# Patient Record
Sex: Female | Born: 1945
Health system: Southern US, Community
[De-identification: ages and names within clinical notes are randomized; demographics above are authoritative.]

## PROBLEM LIST (undated history)

## (undated) DIAGNOSIS — I10 Essential (primary) hypertension: Secondary | ICD-10-CM

## (undated) DIAGNOSIS — Z720 Tobacco use: Secondary | ICD-10-CM

## (undated) DIAGNOSIS — F419 Anxiety disorder, unspecified: Secondary | ICD-10-CM

## (undated) DIAGNOSIS — L309 Dermatitis, unspecified: Secondary | ICD-10-CM

## (undated) DIAGNOSIS — E785 Hyperlipidemia, unspecified: Secondary | ICD-10-CM

## (undated) DIAGNOSIS — K219 Gastro-esophageal reflux disease without esophagitis: Secondary | ICD-10-CM

## (undated) HISTORY — DX: Hyperlipidemia, unspecified: E78.5

## (undated) HISTORY — DX: Gastro-esophageal reflux disease without esophagitis: K21.9

## (undated) HISTORY — DX: Anxiety disorder, unspecified: F41.9

## (undated) HISTORY — PX: BREAST SURGERY: SHX581

## (undated) HISTORY — DX: Dermatitis, unspecified: L30.9

## (undated) HISTORY — DX: Essential (primary) hypertension: I10

## (undated) HISTORY — DX: Tobacco use: Z72.0

---

## 1997-10-28 ENCOUNTER — Emergency Department (HOSPITAL_COMMUNITY): Admission: EM | Admit: 1997-10-28 | Discharge: 1997-10-28 | Payer: Self-pay

## 1998-04-10 ENCOUNTER — Emergency Department (HOSPITAL_COMMUNITY): Admission: EM | Admit: 1998-04-10 | Discharge: 1998-04-10 | Payer: Self-pay | Admitting: Emergency Medicine

## 2004-01-17 ENCOUNTER — Ambulatory Visit: Payer: Self-pay | Admitting: Internal Medicine

## 2004-03-24 ENCOUNTER — Ambulatory Visit: Payer: Self-pay | Admitting: Internal Medicine

## 2005-12-21 ENCOUNTER — Ambulatory Visit: Payer: Self-pay | Admitting: Internal Medicine

## 2006-02-14 ENCOUNTER — Ambulatory Visit: Payer: Self-pay | Admitting: Internal Medicine

## 2007-02-28 ENCOUNTER — Telehealth: Payer: Self-pay | Admitting: Internal Medicine

## 2007-03-13 ENCOUNTER — Ambulatory Visit: Payer: Self-pay | Admitting: Internal Medicine

## 2007-03-13 DIAGNOSIS — I10 Essential (primary) hypertension: Secondary | ICD-10-CM

## 2007-03-13 DIAGNOSIS — K219 Gastro-esophageal reflux disease without esophagitis: Secondary | ICD-10-CM | POA: Insufficient documentation

## 2008-03-05 ENCOUNTER — Ambulatory Visit: Payer: Self-pay | Admitting: Internal Medicine

## 2008-03-05 DIAGNOSIS — M25569 Pain in unspecified knee: Secondary | ICD-10-CM

## 2012-02-10 ENCOUNTER — Ambulatory Visit (INDEPENDENT_AMBULATORY_CARE_PROVIDER_SITE_OTHER): Admitting: Family Medicine

## 2012-02-10 ENCOUNTER — Encounter: Payer: Self-pay | Admitting: Family Medicine

## 2012-02-10 ENCOUNTER — Telehealth: Payer: Self-pay | Admitting: Family Medicine

## 2012-02-10 VITALS — BP 178/118 | HR 98 | Temp 97.7°F | Ht 66.5 in | Wt 168.0 lb

## 2012-02-10 DIAGNOSIS — Z7689 Persons encountering health services in other specified circumstances: Secondary | ICD-10-CM

## 2012-02-10 DIAGNOSIS — I10 Essential (primary) hypertension: Secondary | ICD-10-CM

## 2012-02-10 DIAGNOSIS — F17200 Nicotine dependence, unspecified, uncomplicated: Secondary | ICD-10-CM

## 2012-02-10 DIAGNOSIS — Z720 Tobacco use: Secondary | ICD-10-CM | POA: Insufficient documentation

## 2012-02-10 DIAGNOSIS — Z7189 Other specified counseling: Secondary | ICD-10-CM

## 2012-02-10 HISTORY — DX: Tobacco use: Z72.0

## 2012-02-10 LAB — LIPID PANEL
Cholesterol: 215 mg/dL — ABNORMAL HIGH (ref 0–200)
HDL: 27.8 mg/dL — ABNORMAL LOW (ref >39.00)
Total CHOL/HDL Ratio: 8
Triglycerides: 108 mg/dL (ref 0.0–149.0)
VLDL: 21.6 mg/dL (ref 0.0–40.0)

## 2012-02-10 LAB — BASIC METABOLIC PANEL
BUN: 8 mg/dL (ref 6–23)
CO2: 28 mEq/L (ref 19–32)
Calcium: 9.5 mg/dL (ref 8.4–10.5)
Chloride: 104 mEq/L (ref 96–112)
Creatinine, Ser: 0.6 mg/dL (ref 0.4–1.2)
GFR: 126.89 mL/min (ref >60.00)
Glucose, Bld: 100 mg/dL — ABNORMAL HIGH (ref 70–99)
Potassium: 4.1 mEq/L (ref 3.5–5.1)
Sodium: 139 mEq/L (ref 135–145)

## 2012-02-10 LAB — LDL CHOLESTEROL, DIRECT: Direct LDL: 150.4 mg/dL

## 2012-02-10 LAB — HEMOGLOBIN A1C: Hgb A1c MFr Bld: 6.2 % (ref 4.6–6.5)

## 2012-02-10 MED ORDER — AMLODIPINE BESYLATE 10 MG PO TABS: 10.0000 mg | ORAL_TABLET | Freq: Every day | ORAL | Status: DC

## 2012-02-10 MED ORDER — BENAZEPRIL-HYDROCHLOROTHIAZIDE 20-12.5 MG PO TABS: 1.0000 | ORAL_TABLET | Freq: Every day | ORAL | Status: DC

## 2012-02-10 NOTE — Progress Notes (Signed)
Chief Complaint  Patient presents with  . Establish Care    HPI:  Alexandra Flores is here to establish care. Used to see Dr. Amador Cunas, but hasn't been in the office in a long time. Reports has has felt well.  Last PCP and physical: 6 years ago  Has the following chronic problems and concerns today:  Patient Active Problem List  Diagnosis  . HYPERTENSION -denies: CP, SOB, palpitaitons, HA, vision changes, urinary symptoms, fatigue, weight loss - feel sgreat -does not take BP medications except maybe 1-2 times per month  . GERD  . KNEE PAIN  . Tobacco use -wants to discuss quitting, but not interested now - really likes to smoke  Occ bulging sensation in vaginal area when bowling - chronic, no pain, no vaginal discharge or irritation.   Health Maintenance: -needs health maintenance -does not want flu vaccine will discuss other vaccine at her physical  ROS: See pertinent positives and negatives per HPI.  Past Medical History  Diagnosis Date  . Hypertension   . GERD (gastroesophageal reflux disease)     Family History  Problem Relation Age of Onset  . Stroke Mother   . Hypertension Mother   . Cancer Sister 81    'stomach cancer'     History   Social History  . Marital Status: Married    Spouse Name: N/A    Number of Children: N/A  . Years of Education: N/A   Social History Main Topics  . Smoking status: Current Every Day Smoker -- 0.8 packs/day    Types: Cigarettes  . Smokeless tobacco: None  . Alcohol Use: Yes     Comment: occ   . Drug Use: None  . Sexually Active: None   Other Topics Concern  . None   Social History Narrative   Work or School: none, retired, worked at [employer] for 25 years Home Situation: lives with mother and husbandSpiritual Beliefs: methodist, christianLifestyle: does bowling every Wednesday, no regular exercise otherwise, diet is not great - husband is active and exercises    Current outpatient prescriptions:amLODipine (NORVASC)  10 MG tablet, Take 1 tablet (10 mg total) by mouth daily., Disp: 90 tablet, Rfl: 3;  benazepril-hydrochlorthiazide (LOTENSIN HCT) 20-12.5 MG per tablet, Take 1 tablet by mouth daily., Disp: 90 tablet, Rfl: 3  EXAM:  Filed Vitals:   02/10/12 0802  BP: 178/118  Pulse: 98  Temp: 97.7 F (36.5 C)    Body mass index is 26.71 kg/(m^2).  GENERAL: vitals reviewed and listed above, alert, oriented, appears well hydrated and in no acute distress  HEENT: atraumatic, conjunttiva clear, no obvious abnormalities on inspection of external nose and ears  NECK: no obvious masses on inspection  LUNGS: clear to auscultation bilaterally, no wheezes, rales or rhonchi, good air movement  CV: HRRR, no peripheral edema  MS: moves all extremities without noticeable abnormality  PSYCH: pleasant and cooperative, no obvious depression or anxiety  ASSESSMENT AND PLAN:  Discussed the following assessment and plan:  1. Tobacco use    2. HYPERTENSION  amLODipine (NORVASC) 10 MG tablet, benazepril-hydrochlorthiazide (LOTENSIN HCT) 20-12.5 MG per tablet, Basic metabolic panel  3. Establishing care with new doctor, encounter for  Lipid Panel, Hemoglobin A1c    FASTING LABS TODAY: -refilled BP meds and close follow up - advised of risks with elevated BP - completely asymptomatic at this time -smoking cessation counseling for 3 minutes - will continue to discuss -advised to healthy diet and exercise -follow up in 2 weeks  to recheck BP and for physical to assess pelvic floor and pap and health maintenance  -We reviewed the PMH, PSH, FH, SH, Meds and Allergies. -We provided refills for any medications we will prescribe as needed. -We addressed current concerns per orders and patient instructions. -We have advised patient to follow up per instructions below.  -Patient advised to return or notify a doctor immediately if symptoms worsen or persist or new concerns arise.  Patient Instructions  -We have  ordered labs or studies at this visit. It can take up to 1-2 weeks for results and processing. We will contact you with instructions IF your results are abnormal. Normal results will be released to your [facility]. If you have not heard from [place] or can not find your results in [facility] in 2 weeks please contact our office.  -PLEASE SIGN UP FOR MYCHART TODAY   We recommend the following healthy lifestyle measures: - eat a healthy diet consisting of lots of vegetables, fruits, beans, nuts, seeds, healthy meats such as white chicken and fish and whole grains.  - avoid fried foods, fast food, processed foods, sodas, red meet and other fattening foods.  - get a least 150 minutes of aerobic exercise per week.   Call quit line for information about quitting smoking  Start exercising 4 - 5 days per week  TAKE BLOOD PRESSURE MEDICATIONS EVERY DAY  Follow up in: 2 weeks for physical      KIM, Dahlia Client R.

## 2012-02-10 NOTE — Patient Instructions (Addendum)
-  We have ordered labs or studies at this visit. It can take up to 1-2 weeks for results and processing. We will contact you with instructions IF your results are abnormal. Normal results will be released to your [facility]. If you have not heard from [place] or can not find your results in [facility] in 2 weeks please contact our office.  -PLEASE SIGN UP FOR MYCHART TODAY   We recommend the following healthy lifestyle measures: - eat a healthy diet consisting of lots of vegetables, fruits, beans, nuts, seeds, healthy meats such as white chicken and fish and whole grains.  - avoid fried foods, fast food, processed foods, sodas, red meet and other fattening foods.  - get a least 150 minutes of aerobic exercise per week.   Call quit line for information about quitting smoking  Start exercising 4 - 5 days per week  TAKE BLOOD PRESSURE MEDICATIONS EVERY DAY  Follow up in: 2 weeks for physical

## 2012-02-10 NOTE — Telephone Encounter (Signed)
Please let patient know,   -cholesterol is a bit high -diabetes screening lab is a little high (>5.6) indicating a risk for developing diabetes  The best treatment to hopefully reverse these findings and prevent adverse health outcomes is a healthy diet and regular exercise.  We can discuss further at her follow up.

## 2012-02-11 NOTE — Telephone Encounter (Signed)
Called and spoke with pt and pt is aware.  

## 2012-02-24 ENCOUNTER — Ambulatory Visit (INDEPENDENT_AMBULATORY_CARE_PROVIDER_SITE_OTHER): Admitting: Family Medicine

## 2012-02-24 ENCOUNTER — Other Ambulatory Visit (HOSPITAL_COMMUNITY)
Admission: RE | Admit: 2012-02-24 | Discharge: 2012-02-24 | Disposition: A | Discharge status: Home / Self Care | Source: Ambulatory Visit | Attending: Family Medicine | Admitting: Family Medicine

## 2012-02-24 ENCOUNTER — Encounter: Payer: Self-pay | Admitting: Family Medicine

## 2012-02-24 VITALS — BP 200/100 | HR 91 | Temp 98.5°F | Ht 66.5 in | Wt 169.0 lb

## 2012-02-24 DIAGNOSIS — I10 Essential (primary) hypertension: Secondary | ICD-10-CM

## 2012-02-24 DIAGNOSIS — Z01419 Encounter for gynecological examination (general) (routine) without abnormal findings: Secondary | ICD-10-CM | POA: Insufficient documentation

## 2012-02-24 DIAGNOSIS — R87810 Cervical high risk human papillomavirus (HPV) DNA test positive: Secondary | ICD-10-CM | POA: Insufficient documentation

## 2012-02-24 DIAGNOSIS — Z0000 Encounter for general adult medical examination without abnormal findings: Secondary | ICD-10-CM

## 2012-02-24 DIAGNOSIS — N8111 Cystocele, midline: Secondary | ICD-10-CM

## 2012-02-24 DIAGNOSIS — Z1151 Encounter for screening for human papillomavirus (HPV): Secondary | ICD-10-CM | POA: Insufficient documentation

## 2012-02-24 DIAGNOSIS — Z23 Encounter for immunization: Secondary | ICD-10-CM

## 2012-02-24 DIAGNOSIS — IMO0002 Reserved for concepts with insufficient information to code with codable children: Secondary | ICD-10-CM

## 2012-02-24 MED ORDER — PRAVASTATIN SODIUM 20 MG PO TABS: 20.0000 mg | ORAL_TABLET | Freq: Every day | ORAL | Status: DC

## 2012-02-24 MED ORDER — BENAZEPRIL-HYDROCHLOROTHIAZIDE 20-25 MG PO TABS: 1.0000 | ORAL_TABLET | Freq: Every day | ORAL | Status: DC

## 2012-02-24 NOTE — Patient Instructions (Addendum)
-  please call and schedule your mammogram  -please send in stool cards per instruction  -please schedule an appointment with a dentist  -Please take two of your Lotensin-HCT tabs daily until you get the new prescription  -Please start the new medication (Pravastatin) for you cholesterol  We recommend the following healthy lifestyle measures: - eat a healthy diet consisting of lots of vegetables, fruits, beans, nuts, seeds, healthy meats such as white chicken and fish and whole grains.  - avoid fried foods, fast food, processed foods, sodas, red meet and other fattening foods.  - get a least 150 minutes of aerobic exercise per week.   -We placed a referral for you as discussed. It usually takes about 1-2 weeks to process and schedule this referral. If you have not heard from [place] regarding this appointment in 2 weeks please contact our office.  -follow up in 1 month for you blood pressure and cholesterol

## 2012-02-24 NOTE — Progress Notes (Signed)
Chief Complaint  Patient presents with  . Annual Exam    HPI:  Here for CPE:  -Concerns today: BP meds restarted last visit (norvasc and lotensin), bulge in vaginal area when bowling  -Diet: variety of foods, balance and well rounded, larger portion sizes  -Exercise: walking every day  -Diabetes and Dyslipidemia Screening: 01/2012 - HLD and prediabetes  -Hx of HTN: being treated, high today, denies CP, SOB, palpations, HA, vision changes, swelling  -Vaccines: wants tetanus today, refused all other vaccines  -pap history: last pap in 2008, reports always normal  -FDLMP: postmenopausal  -sexual activity: yes, female partner, no new partners  -wants STI testing: no  -FH breast, colon or ovarian ca: see FH -last colonoscopy: never had a colonoscopy, wants to do stool cards -last dexa: never had this, refused -last mammo: 2008, no breast lesions or concerns  -Alcohol, Tobacco, drug use: see social history -has cut back on cigarettes to 1/2 pack -motivated to quit: 6/10 -wants to quit because it is impacting her BP and she wants to live long -not interested in meds to hlep her quit  Review of Systems - negative except where noted above  Past Medical History  Diagnosis Date  . Hypertension   . GERD (gastroesophageal reflux disease)     Family History  Problem Relation Age of Onset  . Stroke Mother   . Hypertension Mother   . Cancer Sister 20    'stomach cancer'     History   Social History  . Marital Status: Married    Spouse Name: N/A    Number of Children: N/A  . Years of Education: N/A   Social History Main Topics  . Smoking status: Current Every Day Smoker -- 0.8 packs/day    Types: Cigarettes  . Smokeless tobacco: None     Comment: per patient not even a pack a day; takes 3 puffs and puts it out   . Alcohol Use: Yes     Comment: occ   . Drug Use: None  . Sexually Active: None   Other Topics Concern  . None   Social History Narrative   Work or  School: none, retired, worked at [employer] for 25 years Home Situation: lives with mother and husbandSpiritual Beliefs: methodist, christianLifestyle: does bowling every Wednesday, no regular exercise otherwise, diet is not great - husband is active and exercises    Current outpatient prescriptions:amLODipine (NORVASC) 10 MG tablet, Take 1 tablet (10 mg total) by mouth daily., Disp: 90 tablet, Rfl: 3;  benazepril-hydrochlorthiazide (LOTENSIN HCT) 20-25 MG per tablet, Take 1 tablet by mouth daily., Disp: 90 tablet, Rfl: 3;  pravastatin (PRAVACHOL) 20 MG tablet, Take 1 tablet (20 mg total) by mouth daily., Disp: 90 tablet, Rfl: 3  EXAM:  Filed Vitals:   02/24/12 1123  BP: 200/100  Pulse: 91  Temp: 98.5 F (36.9 C)    GENERAL: vitals reviewed and listed below, alert, oriented, appears well hydrated and in no acute distress  HEENT: head atraumatic, PERRLA, normal appearance of eyes, ears, nose and mouth. moist mucus membranes.  NECK: supple, no masses or lymphadenopathy  LUNGS: clear to auscultation bilaterally, no rales, rhonchi or wheeze  CV: HRRR, no peripheral edema or cyanosis, normal pedal pulses  BREAST: normal appearance - no lesions or discharge, on palpation normal breast tissue without any suspicious masses  ABDOMEN: bowel sounds normal, soft, non tender to palpation, no masses, no rebound or guarding  GU: normal appearance of external genitalia - no lesions  or masses, normal vaginal mucosa - no abnormal discharge, normal appearance of cervix - no lesions or abnormal discharge, no masses or tenderness on palpation of uterus and ovaries. Does have bulgin in ant vagina with valsalva  RECTAL: refused  SKIN: no rash or abnormal lesions  MS: normal gait, moves all extremities normally  NEURO: CN II-XII grossly intact, normal muscle strength and sensation to light touch on extremities  PSYCH: normal affect, pleasant and cooperative  ASSESSMENT AND PLAN:  Discussed the  following assessment and plan:  1. HYPERTENSION  pravastatin (PRAVACHOL) 20 MG tablet, benazepril-hydrochlorthiazide (LOTENSIN HCT) 20-25 MG per tablet  2. Routine general medical examination at a health care facility  Cytology - PAP  3. Need for Tdap vaccination  Tdap vaccine greater than or equal to 7yo IM  4. Cystocele  Ambulatory referral to Gynecology    -healthy 67 yo Female:  -Discussed and advised all [place] preventive services health task force level A and B recommendations for age, sex and risks. -smoking cessation last visit -pap today -discussed options for HLD - she wants to start pravastatin - risks discussed -increasing Lotensin HCT -stool cards per her preference given -pt to schedule mammo - number given -tdap given -smoking cessation counseling 4 minutes, offered medications and discussed options, she prefers to keep trying to cut back on her own -referred to gyn for possible cystocele -follow up in 1 month for HTN   -Advised at least 150 minutes of exercise per week and a healthy diet low in saturated fats and sweets and consisting of fresh fruits and vegetables, lean meats such as fish and white chicken and whole grains.  -labs, studies and vaccines per orders this encounter  Orders Placed This Encounter  Procedures  . Tdap vaccine greater than or equal to 7yo IM  . Ambulatory referral to Gynecology    Referral Priority:  Routine    Referral Type:  Consultation    Referral Reason:  Specialty Services Required    Requested Specialty:  Gynecology    Number of Visits Requested:  1    Patient Instructions  -please call and schedule your mammogram  -please send in stool cards per instruction  -please schedule an appointment with a dentist  -Please take two of your Lotensin-HCT tabs daily until you get the new prescription  -Please start the new medication (Pravastatin) for you cholesterol  We recommend the following healthy lifestyle measures: - eat a  healthy diet consisting of lots of vegetables, fruits, beans, nuts, seeds, healthy meats such as white chicken and fish and whole grains.  - avoid fried foods, fast food, processed foods, sodas, red meet and other fattening foods.  - get a least 150 minutes of aerobic exercise per week.   -follow up in 1 month for you blood pressure and cholesterol    Patient advised to return to clinic immediately if symptoms worsen or persist or new concerns.  @LIFEPLAN @  No Follow-up on file.  Kriste Basque R.

## 2012-02-25 ENCOUNTER — Telehealth: Payer: Self-pay

## 2012-02-25 DIAGNOSIS — I10 Essential (primary) hypertension: Secondary | ICD-10-CM

## 2012-02-25 MED ORDER — PRAVASTATIN SODIUM 20 MG PO TABS: 20.0000 mg | ORAL_TABLET | Freq: Every day | ORAL | Status: DC

## 2012-02-25 MED ORDER — AMLODIPINE BESYLATE 10 MG PO TABS: 10.0000 mg | ORAL_TABLET | Freq: Every day | ORAL | Status: DC

## 2012-02-25 NOTE — Telephone Encounter (Signed)
Pt called and states that she did not received the cholesterol medication that Dr. Selena Batten prescribed on 02/24/12.  Explained to pt that Dr. Selena Batten printed both the cholesterol (pravastin) and new bp medication at visit.  Pt states she understands.  Pt request to have cholesterol medication sent to [facility] [address] for a 30 day supply as well as pt request to pick up a printed rx for amlodipine to send to her mail order pharmacy so that it is free.   Amlodipine rx ready for pick up and [facility]astain [facility].

## 2012-03-06 ENCOUNTER — Telehealth: Payer: Self-pay | Admitting: Family Medicine

## 2012-03-06 NOTE — Telephone Encounter (Signed)
Called and spoke wit pt and pt is aware.

## 2012-03-06 NOTE — Telephone Encounter (Signed)
Her pap was normal. She did have HPV - so I do recommend a repeat pap in 3 years.

## 2012-03-10 ENCOUNTER — Other Ambulatory Visit: Payer: Self-pay

## 2012-03-10 MED ORDER — HYDROCHLOROTHIAZIDE 25 MG PO TABS: 25.0000 mg | ORAL_TABLET | Freq: Every day | ORAL | Status: DC

## 2012-03-10 MED ORDER — BENAZEPRIL HCL 20 MG PO TABS: 20.0000 mg | ORAL_TABLET | Freq: Every day | ORAL | Status: DC

## 2012-03-10 NOTE — Telephone Encounter (Signed)
Incoming fax from [facility] requesting a separate rx for benazepril hcl 20/hctz 25 mg.  Ok per Dr. Selena Batten to do separate rx. Rx faxed back to [facility] at [phone].

## 2012-03-14 ENCOUNTER — Encounter: Payer: Self-pay | Admitting: Family Medicine

## 2012-03-14 NOTE — Progress Notes (Signed)
Received OV note from Dr. Juliene Pina in gyn. No uterine prolapse or cystocele appreciated per notes.

## 2012-03-17 ENCOUNTER — Other Ambulatory Visit: Payer: Self-pay

## 2012-03-17 DIAGNOSIS — Z1231 Encounter for screening mammogram for malignant neoplasm of breast: Secondary | ICD-10-CM

## 2012-03-23 ENCOUNTER — Encounter: Payer: Self-pay | Admitting: Family Medicine

## 2012-03-23 ENCOUNTER — Ambulatory Visit: Admitting: Internal Medicine

## 2012-03-23 ENCOUNTER — Ambulatory Visit (INDEPENDENT_AMBULATORY_CARE_PROVIDER_SITE_OTHER): Admitting: Family Medicine

## 2012-03-23 VITALS — BP 132/80 | HR 100 | Temp 98.3°F | Wt 169.0 lb

## 2012-03-23 DIAGNOSIS — D219 Benign neoplasm of connective and other soft tissue, unspecified: Secondary | ICD-10-CM | POA: Insufficient documentation

## 2012-03-23 DIAGNOSIS — F17200 Nicotine dependence, unspecified, uncomplicated: Secondary | ICD-10-CM

## 2012-03-23 DIAGNOSIS — D259 Leiomyoma of uterus, unspecified: Secondary | ICD-10-CM

## 2012-03-23 MED ORDER — AMLODIPINE BESYLATE 10 MG PO TABS: 10.0000 mg | ORAL_TABLET | Freq: Every day | ORAL | Status: DC

## 2012-03-23 NOTE — Progress Notes (Signed)
Chief Complaint  Patient presents with  . 1 month follow up    HPI:  Follow up:  HTN: -increased benazpril/HCT 20/12.4 to two tablets daily last visit and she takes norvasc 5 mg daily -denies: CP, SOB, HA, vision changes -BP at home has been in 140-150s/80 -walking about 30 minutes every day  HLD: -restarted pravastatin last visit  Tobacco: -trying to cut back, has refused help -now smoking 5 cigarettes per day -reasons to quit: she knows cigarettes are hurting her blood pressure -she really likes to smoke  Uterine Fibroids: -saw gyn for bulging - and told uterine fibroids -they will observe and may consider hysterectomy  ROS: See pertinent positives and negatives per HPI.  Past Medical History  Diagnosis Date  . Hypertension   . GERD (gastroesophageal reflux disease)     Family History  Problem Relation Age of Onset  . Stroke Mother   . Hypertension Mother   . Cancer Sister 43    'stomach cancer'     History   Social History  . Marital Status: Married    Spouse Name: N/A    Number of Children: N/A  . Years of Education: N/A   Social History Main Topics  . Smoking status: Current Every Day Smoker -- 0.80 packs/day    Types: Cigarettes  . Smokeless tobacco: None     Comment: per patient not even a pack a day; takes 3 puffs and puts it out   . Alcohol Use: Yes     Comment: occ   . Drug Use: None  . Sexually Active: None   Other Topics Concern  . None   Social History Narrative   Work or School: none, retired, worked at [employer] for 25 years       Home Situation: lives with mother and husband      Spiritual Beliefs: methodist, christian      Lifestyle: does bowling every Wednesday, no regular exercise otherwise, diet is not great - husband is active and exercises             Current outpatient prescriptions:amLODipine (NORVASC) 10 MG tablet, Take 1 tablet (10 mg total) by mouth daily., Disp: 90 tablet, Rfl: 3;  benazepril (LOTENSIN) 20 MG  tablet, Take 1 tablet (20 mg total) by mouth daily., Disp: 90 tablet, Rfl: 3;  benazepril-hydrochlorthiazide (LOTENSIN HCT) 20-25 MG per tablet, Take 1 tablet by mouth daily., Disp: 90 tablet, Rfl: 3 hydrochlorothiazide (HYDRODIURIL) 25 MG tablet, Take 1 tablet (25 mg total) by mouth daily., Disp: 90 tablet, Rfl: 3;  pravastatin (PRAVACHOL) 20 MG tablet, Take 1 tablet (20 mg total) by mouth daily., Disp: 30 tablet, Rfl: 0  EXAM:  Filed Vitals:   03/23/12 1123  BP: 132/80  Pulse: 100  Temp: 98.3 F (36.8 C)    Body mass index is 26.87 kg/(m^2).  GENERAL: vitals reviewed and listed above, alert, oriented, appears well hydrated and in no acute distress  HEENT: atraumatic, conjunttiva clear, no obvious abnormalities on inspection of external nose and ears  NECK: no obvious masses on inspection  LUNGS: clear to auscultation bilaterally, no wheezes, rales or rhonchi, good air movement  CV: HRRR, no peripheral edema  MS: moves all extremities without noticeable abnormality  PSYCH: pleasant and cooperative, no obvious depression or anxiety  ASSESSMENT AND PLAN:  Discussed the following assessment and plan:  HYPERTENSION - Plan: amLODipine (NORVASC) 10 MG tablet  Tobacco use  Fibroids  Hyperlipemia -fblood pressure is improved, gave her Rx for the  norvasc -will continue other medications -advised to quit smoking and discussed risks and options for 4 mintues - she will try the gum, coupon provided -she has follow up with the gynecologist for her fibroids -mammogram is scheduled -follow up in 3 months -Patient advised to return or notify a doctor immediately if symptoms worsen or persist or new concerns arise.  Patient Instructions  -stop smoking - use the gum  -take 40mg  of benazepril, 25mg  of the HCTZ (hydrochlorothiazide) and 10mg  of the Norvasc daily  We recommend the following healthy lifestyle measures: - eat a healthy diet consisting of lots of vegetables, fruits,  beans, nuts, seeds, healthy meats such as white chicken and fish and whole grains.  - avoid fried foods, fast food, processed foods, sodas, red meet and other fattening foods.  - get a least 150 minutes of aerobic exercise per week.    -follow up in 3 months     Ralph Brouwer R.

## 2012-03-23 NOTE — Patient Instructions (Addendum)
-  stop smoking - use the gum  -take 40mg  of benazepril, 25mg  of the HCTZ (hydrochlorothiazide) and 10mg  of the Norvasc daily  We recommend the following healthy lifestyle measures: - eat a healthy diet consisting of lots of vegetables, fruits, beans, nuts, seeds, healthy meats such as white chicken and fish and whole grains.  - avoid fried foods, fast food, processed foods, sodas, red meet and other fattening foods.  - get a least 150 minutes of aerobic exercise per week.    -follow up in 3 months

## 2012-04-13 ENCOUNTER — Ambulatory Visit
Admission: RE | Admit: 2012-04-13 | Discharge: 2012-04-13 | Disposition: A | Discharge status: Home / Self Care | Source: Ambulatory Visit

## 2012-04-13 DIAGNOSIS — Z1231 Encounter for screening mammogram for malignant neoplasm of breast: Secondary | ICD-10-CM

## 2012-04-14 ENCOUNTER — Other Ambulatory Visit: Payer: Self-pay | Admitting: Obstetrics & Gynecology

## 2012-04-14 DIAGNOSIS — R928 Other abnormal and inconclusive findings on diagnostic imaging of breast: Secondary | ICD-10-CM

## 2012-04-15 ENCOUNTER — Other Ambulatory Visit: Payer: Self-pay | Admitting: Family Medicine

## 2012-04-17 ENCOUNTER — Telehealth: Payer: Self-pay | Admitting: Family Medicine

## 2012-04-17 NOTE — Telephone Encounter (Signed)
Error

## 2012-04-26 ENCOUNTER — Ambulatory Visit
Admission: RE | Admit: 2012-04-26 | Discharge: 2012-04-26 | Disposition: A | Discharge status: Home / Self Care | Source: Ambulatory Visit | Attending: Obstetrics & Gynecology | Admitting: Obstetrics & Gynecology

## 2012-04-26 DIAGNOSIS — R928 Other abnormal and inconclusive findings on diagnostic imaging of breast: Secondary | ICD-10-CM

## 2012-05-11 ENCOUNTER — Ambulatory Visit (INDEPENDENT_AMBULATORY_CARE_PROVIDER_SITE_OTHER): Admitting: Family Medicine

## 2012-05-11 VITALS — BP 144/88 | Temp 97.9°F | Wt 164.0 lb

## 2012-05-11 DIAGNOSIS — J329 Chronic sinusitis, unspecified: Secondary | ICD-10-CM

## 2012-05-11 MED ORDER — AMOXICILLIN 875 MG PO TABS: 875.0000 mg | ORAL_TABLET | Freq: Two times a day (BID) | ORAL | Status: DC

## 2012-05-11 NOTE — Progress Notes (Signed)
Chief Complaint  Patient presents with  . Sinusitis    facial pain and pressure, nose burning, dizziness     HPI:  Acute visit for sinus issues: -started about 2 weeks ago -symptoms: sneezing, nasal congestion, coughing, drainage in throat, dizziness, nose burning, sinus pain and pressure -Denies: fevers, chills, SOB, tooth pain -sick contacts: none known -tried OTC decongestant -hx of sinus infections in the past and has intermittent issues with her sinuses and has had some dizziness when sinuses act up -she has cut back on smoking - not smoking every day - at most on bad days 4 cigarettes  ROS: See pertinent positives and negatives per HPI.  Past Medical History  Diagnosis Date  . Hypertension   . GERD (gastroesophageal reflux disease)     Family History  Problem Relation Age of Onset  . Stroke Mother   . Hypertension Mother   . Cancer Sister 25    'stomach cancer'     History   Social History  . Marital Status: Married    Spouse Name: N/A    Number of Children: N/A  . Years of Education: N/A   Social History Main Topics  . Smoking status: Current Every Day Smoker -- 0.80 packs/day    Types: Cigarettes  . Smokeless tobacco: Not on file     Comment: per patient not even a pack a day; takes 3 puffs and puts it out   . Alcohol Use: Yes     Comment: occ   . Drug Use: Not on file  . Sexually Active: Not on file   Other Topics Concern  . Not on file   Social History Narrative   Work or School: none, retired, worked at [employer] for 25 years       Home Situation: lives with mother and husband      Spiritual Beliefs: methodist, christian      Lifestyle: does bowling every Wednesday, no regular exercise otherwise, diet is not great - husband is active and exercises             Current outpatient prescriptions:amLODipine (NORVASC) 10 MG tablet, Take 1 tablet (10 mg total) by mouth daily., Disp: 90 tablet, Rfl: 3;  benazepril (LOTENSIN) 20 MG tablet, Take 1  tablet (20 mg total) by mouth daily., Disp: 90 tablet, Rfl: 3;  benazepril-hydrochlorthiazide (LOTENSIN HCT) 20-25 MG per tablet, Take 1 tablet by mouth daily., Disp: 90 tablet, Rfl: 3 hydrochlorothiazide (HYDRODIURIL) 25 MG tablet, Take 1 tablet (25 mg total) by mouth daily., Disp: 90 tablet, Rfl: 3;  pravastatin (PRAVACHOL) 20 MG tablet, TAKE 1 TABLET EVERY DAY, Disp: 30 tablet, Rfl: 0;  amoxicillin (AMOXIL) 875 MG tablet, Take 1 tablet (875 mg total) by mouth 2 (two) times daily., Disp: 20 tablet, Rfl: 0  EXAM:  Filed Vitals:   05/11/12 1450  BP: 144/88  Temp: 97.9 F (36.6 C)    Body mass index is 26.08 kg/(m^2).  GENERAL: vitals reviewed and listed above, alert, oriented, appears well hydrated and in no acute distress  HEENT: atraumatic, conjunttiva clear, no obvious abnormalities on inspection of external nose and ears, normal appearance of ear canals and TMs, white nasal congestion, mild post oropharyngeal erythema with PND, no tonsillar edema or exudate, no sinus TTP  NECK: no obvious masses on inspection  LUNGS: clear to auscultation bilaterally, no wheezes, rales or rhonchi, good air movement  CV: HRRR, no peripheral edema  MS: moves all extremities without noticeable abnormality  PSYCH: pleasant and cooperative,  no obvious depression or anxiety  ASSESSMENT AND PLAN:  Discussed the following assessment and plan:  Sinusitis - Plan: amoxicillin (AMOXIL) 875 MG tablet  -tx with abx - risks discussed, supportive care and return precuations -Patient advised to return or notify a doctor immediately if symptoms worsen or persist or new concerns arise.  There are no Patient Instructions on file for this visit.   Kriste Basque R.

## 2012-06-22 ENCOUNTER — Ambulatory Visit: Admitting: Family Medicine

## 2012-07-07 ENCOUNTER — Ambulatory Visit (INDEPENDENT_AMBULATORY_CARE_PROVIDER_SITE_OTHER): Admitting: Family Medicine

## 2012-07-07 ENCOUNTER — Encounter: Payer: Self-pay | Admitting: Family Medicine

## 2012-07-07 VITALS — BP 118/80 | Temp 98.6°F | Wt 162.0 lb

## 2012-07-07 DIAGNOSIS — E785 Hyperlipidemia, unspecified: Secondary | ICD-10-CM

## 2012-07-07 DIAGNOSIS — R7303 Prediabetes: Secondary | ICD-10-CM

## 2012-07-07 DIAGNOSIS — Z720 Tobacco use: Secondary | ICD-10-CM

## 2012-07-07 DIAGNOSIS — I10 Essential (primary) hypertension: Secondary | ICD-10-CM

## 2012-07-07 DIAGNOSIS — F17200 Nicotine dependence, unspecified, uncomplicated: Secondary | ICD-10-CM

## 2012-07-07 DIAGNOSIS — R7309 Other abnormal glucose: Secondary | ICD-10-CM

## 2012-07-07 LAB — BASIC METABOLIC PANEL
BUN: 6 mg/dL (ref 6–23)
Chloride: 101 mEq/L (ref 96–112)
Glucose, Bld: 131 mg/dL — ABNORMAL HIGH (ref 70–99)
Potassium: 3.3 mEq/L — ABNORMAL LOW (ref 3.5–5.1)

## 2012-07-07 LAB — LIPID PANEL
Cholesterol: 156 mg/dL (ref 0–200)
LDL Cholesterol: 108 mg/dL — ABNORMAL HIGH (ref 0–99)
Triglycerides: 96 mg/dL (ref 0.0–149.0)

## 2012-07-07 NOTE — Progress Notes (Signed)
Quick Note:  Called and spoke with pt and pt is aware. ______ 

## 2012-07-07 NOTE — Progress Notes (Signed)
Chief Complaint  Patient presents with  . 3 month follow up    HPI:  Follow up:  HTN: -takes benazepril 20mg , HCTZ 25 and norvasc 5mg  -home BP has been good -denies: CP, SOB, DOE, swelling, palpitations  HLD/Prediabetes: -on pravastatin -restarted recenltly -lifestyle: walks almost daily, diet has been ok  Tobacco use: -has refused help with quitting -has been cutting back - still about 5 cigarettes daily -was stressed this week as mother was in hospital for rectal bleeding from ASA  ROS: See pertinent positives and negatives per HPI.  Past Medical History  Diagnosis Date  . Hypertension   . GERD (gastroesophageal reflux disease)     Family History  Problem Relation Age of Onset  . Stroke Mother   . Hypertension Mother   . Cancer Sister 71    'stomach cancer'     History   Social History  . Marital Status: Married    Spouse Name: N/A    Number of Children: N/A  . Years of Education: N/A   Social History Main Topics  . Smoking status: Current Every Day Smoker -- 0.80 packs/day    Types: Cigarettes  . Smokeless tobacco: None     Comment: per patient not even a pack a day; takes 3 puffs and puts it out   . Alcohol Use: Yes     Comment: occ   . Drug Use: None  . Sexually Active: None   Other Topics Concern  . None   Social History Narrative   Work or School: none, retired, worked at [employer] for 25 years       Home Situation: lives with mother and husband      Spiritual Beliefs: methodist, christian      Lifestyle: does bowling every Wednesday, no regular exercise otherwise, diet is not great - husband is active and exercises             Current outpatient prescriptions:amLODipine (NORVASC) 10 MG tablet, Take 1 tablet (10 mg total) by mouth daily., Disp: 90 tablet, Rfl: 3;  amoxicillin (AMOXIL) 875 MG tablet, Take 1 tablet (875 mg total) by mouth 2 (two) times daily., Disp: 20 tablet, Rfl: 0;  benazepril (LOTENSIN) 20 MG tablet, Take 1 tablet (20 mg  total) by mouth daily., Disp: 90 tablet, Rfl: 3 hydrochlorothiazide (HYDRODIURIL) 25 MG tablet, Take 1 tablet (25 mg total) by mouth daily., Disp: 90 tablet, Rfl: 3;  pravastatin (PRAVACHOL) 20 MG tablet, TAKE 1 TABLET EVERY DAY, Disp: 30 tablet, Rfl: 0  EXAM:  Filed Vitals:   07/07/12 1013  BP: 118/80  Temp: 98.6 F (37 C)    Body mass index is 25.76 kg/(m^2).  GENERAL: vitals reviewed and listed above, alert, oriented, appears well hydrated and in no acute distress  HEENT: atraumatic, conjunttiva clear, no obvious abnormalities on inspection of external nose and ears  NECK: no obvious masses on inspection  LUNGS: clear to auscultation bilaterally, no wheezes, rales or rhonchi, good air movement  CV: HRRR, no peripheral edema  MS: moves all extremities without noticeable abnormality  PSYCH: pleasant and cooperative, no obvious depression or anxiety  ASSESSMENT AND PLAN:  Discussed the following assessment and plan:  HYPERTENSION - Plan: Basic metabolic panel  Tobacco use  Prediabetes - Plan: Hemoglobin A1c  Hyperlipemia - Plan: Lipid Panel -FASTING LABS today -smoking cessation counseling >3<10 minutes -Patient advised to return or notify a doctor immediately if symptoms worsen or persist or new concerns arise. -follow up 6 months  There  are no Patient Instructions on file for this visit.   Colin Benton R.

## 2012-11-23 ENCOUNTER — Other Ambulatory Visit: Payer: Self-pay

## 2013-01-25 ENCOUNTER — Encounter: Payer: Self-pay | Admitting: Family Medicine

## 2013-01-25 ENCOUNTER — Ambulatory Visit (INDEPENDENT_AMBULATORY_CARE_PROVIDER_SITE_OTHER): Payer: Medicare Other | Admitting: Family Medicine

## 2013-01-25 VITALS — BP 142/88 | HR 105 | Temp 97.6°F | Wt 167.0 lb

## 2013-01-25 DIAGNOSIS — J069 Acute upper respiratory infection, unspecified: Secondary | ICD-10-CM | POA: Diagnosis not present

## 2013-01-25 MED ORDER — HYDROCOD POLST-CHLORPHEN POLST 10-8 MG/5ML PO LQCR: 5.0000 mL | Freq: Two times a day (BID) | ORAL | Status: DC | PRN

## 2013-01-25 NOTE — Progress Notes (Signed)
Chief Complaint  Patient presents with  . Cough    congestion, runny nose x 1 week     HPI:  -started: 1 week ago and improving but still has a cough -symptoms:nasal congestion, sore throat, cough -denies:fever, SOB, NVD, tooth pain -has tried: musinex -sick contacts/travel/risks: denies flu exposure or Ebola risks  ROS: See pertinent positives and negatives per HPI.  Past Medical History  Diagnosis Date  . Hypertension   . GERD (gastroesophageal reflux disease)     Past Surgical History  Procedure Laterality Date  . Breast surgery      benign cyst removal    Family History  Problem Relation Age of Onset  . Stroke Mother   . Hypertension Mother   . Cancer Sister 46    'stomach cancer'     History   Social History  . Marital Status: Married    Spouse Name: N/A    Number of Children: N/A  . Years of Education: N/A   Social History Main Topics  . Smoking status: Current Every Day Smoker -- 0.80 packs/day    Types: Cigarettes  . Smokeless tobacco: None     Comment: per patient not even a pack a day; takes 3 puffs and puts it out   . Alcohol Use: Yes     Comment: occ   . Drug Use: None  . Sexual Activity: None   Other Topics Concern  . None   Social History Narrative   Work or School: none, retired, worked at [employer] for 25 years       Home Situation: lives with mother and husband      Spiritual Beliefs: methodist, christian      Lifestyle: does bowling every Wednesday, no regular exercise otherwise, diet is not great - husband is active and exercises             Current outpatient prescriptions:amLODipine (NORVASC) 10 MG tablet, Take 1 tablet (10 mg total) by mouth daily., Disp: 90 tablet, Rfl: 3;  amoxicillin (AMOXIL) 875 MG tablet, Take 1 tablet (875 mg total) by mouth 2 (two) times daily., Disp: 20 tablet, Rfl: 0;  benazepril (LOTENSIN) 20 MG tablet, Take 1 tablet (20 mg total) by mouth daily., Disp: 90 tablet, Rfl: 3 hydrochlorothiazide  (HYDRODIURIL) 25 MG tablet, Take 1 tablet (25 mg total) by mouth daily., Disp: 90 tablet, Rfl: 3;  pravastatin (PRAVACHOL) 20 MG tablet, TAKE 1 TABLET EVERY DAY, Disp: 30 tablet, Rfl: 0;  chlorpheniramine-HYDROcodone (TUSSIONEX PENNKINETIC ER) 10-8 MG/5ML LQCR, Take 5 mLs by mouth every 12 (twelve) hours as needed for cough., Disp: 115 mL, Rfl: 0  EXAM:  Filed Vitals:   01/25/13 0923  BP: 142/88  Pulse: 105  Temp: 97.6 F (36.4 C)    Body mass index is 26.55 kg/(m^2).  GENERAL: vitals reviewed and listed above, alert, oriented, appears well hydrated and in no acute distress  HEENT: atraumatic, conjunttiva clear, no obvious abnormalities on inspection of external nose and ears, normal appearance of ear canals and TMs, clear nasal congestion, mild post oropharyngeal erythema with PND, no tonsillar edema or exudate, no sinus TTP  NECK: no obvious masses on inspection  LUNGS: clear to auscultation bilaterally, no wheezes, rales or rhonchi, good air movement  CV: HRRR, no peripheral edema  MS: moves all extremities without noticeable abnormality  PSYCH: pleasant and cooperative, no obvious depression or anxiety  ASSESSMENT AND PLAN:  Discussed the following assessment and plan:  Upper respiratory infection - Plan: chlorpheniramine-HYDROcodone (TUSSIONEX PENNKINETIC  ER) 10-8 MG/5ML LQCR  -given HPI and exam findings today, a serious infection or illness is unlikely. We discussed potential etiologies, with VURI being most likely, and advised supportive care and monitoring. We discussed treatment side effects, likely course, antibiotic misuse, transmission, and signs of developing a serious illness. -of course, we advised to return or notify a doctor immediately if symptoms worsen or persist or new concerns arise.    Patient Instructions  INSTRUCTIONS FOR UPPER RESPIRATORY INFECTION:  -plenty of rest and fluids  -nasal saline wash 2-3 times daily (use prepackaged nasal saline or  bottled/distilled water if making your own)   -As we discussed, we have prescribed a new medication for you at this appointment. We discussed the common and serious potential adverse effects of this medication and you can review these and more with the pharmacist when you pick up your medication.  Please follow the instructions for use carefully and notify us immediately if you have any problems taking this medication.  -can use sinex nasal spray for drainage and nasal congestion - but do NOT use longer then 3-4 days  -can use tylenol or ibuprofen as directed for aches and sorethroat  -in the winter time, using a humidifier at night is helpful (please follow cleaning instructions)  -if you are taking a cough medication - use only as directed, may also try a teaspoon of honey to coat the throat and throat lozenges  -for sore throat, salt water gargles can help  -follow up if you have fevers, facial pain, tooth pain, difficulty breathing or are worsening or not getting better in 5-7 days      KIM, HANNAH R.

## 2013-01-25 NOTE — Patient Instructions (Signed)
INSTRUCTIONS FOR UPPER RESPIRATORY INFECTION:  -plenty of rest and fluids  -nasal saline wash 2-3 times daily (use prepackaged nasal saline or bottled/distilled water if making your own)   -As we discussed, we have prescribed a new medication for you at this appointment. We discussed the common and serious potential adverse effects of this medication and you can review these and more with the pharmacist when you pick up your medication.  Please follow the instructions for use carefully and notify us immediately if you have any problems taking this medication.   -can use sinex nasal spray for drainage and nasal congestion - but do NOT use longer then 3-4 days  -can use tylenol or ibuprofen as directed for aches and sorethroat  -in the winter time, using a humidifier at night is helpful (please follow cleaning instructions)  -if you are taking a cough medication - use only as directed, may also try a teaspoon of honey to coat the throat and throat lozenges  -for sore throat, salt water gargles can help  -follow up if you have fevers, facial pain, tooth pain, difficulty breathing or are worsening or not getting better in 5-7 days  

## 2013-01-25 NOTE — Progress Notes (Signed)
Pre visit review using our clinic review tool, if applicable. No additional management support is needed unless otherwise documented below in the visit note. 

## 2013-01-26 ENCOUNTER — Telehealth: Payer: Self-pay | Admitting: Family Medicine

## 2013-01-26 NOTE — Telephone Encounter (Signed)
Relevant patient education assigned to patient using Emmi. ° °

## 2013-05-08 ENCOUNTER — Other Ambulatory Visit: Payer: Self-pay

## 2013-05-08 DIAGNOSIS — I10 Essential (primary) hypertension: Secondary | ICD-10-CM

## 2013-05-08 MED ORDER — HYDROCHLOROTHIAZIDE 25 MG PO TABS: 25.0000 mg | ORAL_TABLET | Freq: Every day | ORAL | Status: DC

## 2013-05-08 MED ORDER — AMLODIPINE BESYLATE 10 MG PO TABS: 10.0000 mg | ORAL_TABLET | Freq: Every day | ORAL | Status: DC

## 2013-05-08 MED ORDER — PRAVASTATIN SODIUM 20 MG PO TABS: ORAL_TABLET | ORAL | Status: DC

## 2013-05-08 MED ORDER — BENAZEPRIL HCL 20 MG PO TABS: 20.0000 mg | ORAL_TABLET | Freq: Every day | ORAL | Status: DC

## 2013-05-08 NOTE — Telephone Encounter (Signed)
Pt in office today with mother and wanted refills of medications to send to [place].  Ok per Dr. Maudie Mercury to refill for 90 days as long as pt makes follow up appt.  Pt is aware and verbalized understanding.

## 2013-09-27 ENCOUNTER — Encounter: Payer: Self-pay | Admitting: Family Medicine

## 2013-09-27 ENCOUNTER — Ambulatory Visit (INDEPENDENT_AMBULATORY_CARE_PROVIDER_SITE_OTHER): Payer: Medicare Other | Admitting: Family Medicine

## 2013-09-27 VITALS — BP 124/86 | HR 95 | Temp 98.0°F | Ht 66.75 in | Wt 162.0 lb

## 2013-09-27 DIAGNOSIS — M79609 Pain in unspecified limb: Secondary | ICD-10-CM | POA: Diagnosis not present

## 2013-09-27 DIAGNOSIS — I10 Essential (primary) hypertension: Secondary | ICD-10-CM

## 2013-09-27 DIAGNOSIS — E785 Hyperlipidemia, unspecified: Secondary | ICD-10-CM | POA: Diagnosis not present

## 2013-09-27 DIAGNOSIS — Z0000 Encounter for general adult medical examination without abnormal findings: Secondary | ICD-10-CM | POA: Diagnosis not present

## 2013-09-27 DIAGNOSIS — F17200 Nicotine dependence, unspecified, uncomplicated: Secondary | ICD-10-CM | POA: Diagnosis not present

## 2013-09-27 DIAGNOSIS — Z720 Tobacco use: Secondary | ICD-10-CM

## 2013-09-27 DIAGNOSIS — M79604 Pain in right leg: Secondary | ICD-10-CM

## 2013-09-27 LAB — LIPID PANEL
CHOL/HDL RATIO: 6
Cholesterol: 168 mg/dL (ref 0–200)
HDL: 29 mg/dL — ABNORMAL LOW (ref >39.00)
LDL Cholesterol: 122 mg/dL — ABNORMAL HIGH (ref 0–99)
NONHDL: 139
Triglycerides: 85 mg/dL (ref 0.0–149.0)
VLDL: 17 mg/dL (ref 0.0–40.0)

## 2013-09-27 LAB — BASIC METABOLIC PANEL
BUN: 9 mg/dL (ref 6–23)
CALCIUM: 9.4 mg/dL (ref 8.4–10.5)
CO2: 29 meq/L (ref 19–32)
CREATININE: 0.7 mg/dL (ref 0.4–1.2)
Chloride: 103 mEq/L (ref 96–112)
GFR: 117.32 mL/min (ref >60.00)
GLUCOSE: 77 mg/dL (ref 70–99)
Potassium: 3.6 mEq/L (ref 3.5–5.1)
SODIUM: 138 meq/L (ref 135–145)

## 2013-09-27 MED ORDER — PRAVASTATIN SODIUM 40 MG PO TABS: 40.0000 mg | ORAL_TABLET | Freq: Every day | ORAL | Status: DC

## 2013-09-27 NOTE — Progress Notes (Signed)
Medicare Annual Preventive Care Visit  (initial annual wellness or annual wellness exam)  Concerns and/or follow up today:  1)HTN: -meds: norvasc 10, benazepril 20, hctz 25 -denies: CP, SOB, swelling, palpitation  2)HLD: -takes pravastatin 20mg  -stable  3)Tobacco Use: -counseled many time int the past -really enjoys, has increased to 1/2 ppd , 19 pack year history -she reports she understands risks but does not want to quit -denies: SOB, cough, hemoptysis  4)R ant leg pain: -in R upper ant leg and knee after bowling competitions -first noticed maybe a few months ago -has only happened a few times after bowling - aleve resolves the pain -mild pain in this area with certain movements, resolves by the next day, has not occurred in some time -denies: weakness, numbness, malaise, bowel or bladder issues, .imitations in activities  Sees a gynecologist for paps, female health, breast health  ROS: negative for report of fevers, unintentional weight loss, vision changes, vision loss, hearing loss or change, chest pain, sob, hemoptysis, melena, hematochezia, hematuria, genital discharge or lesions, falls, bleeding or bruising, loc, thoughts of suicide or self harm, memory loss   1.) Patient-completed health risk assessment  - completed and reviewed, see scanned documentation  2.) Review of Medical History: -PMH, PSH, Family History and current specialty and care providers reviewed and updated and listed below  - see scanned in document in chart and below  Past Medical History  Diagnosis Date  . Hypertension   . GERD (gastroesophageal reflux disease)     Past Surgical History  Procedure Laterality Date  . Breast surgery      benign cyst removal    History   Social History  . Marital Status: Married    Spouse Name: N/A    Number of Children: N/A  . Years of Education: N/A   Occupational History  . Not on file.   Social History Main Topics  . Smoking status: Current  Every Day Smoker -- 0.80 packs/day    Types: Cigarettes  . Smokeless tobacco: Not on file     Comment: per patient not even a pack a day; takes 3 puffs and puts it out   . Alcohol Use: Yes     Comment: occ   . Drug Use: Not on file  . Sexual Activity: Not on file   Other Topics Concern  . Not on file   Social History Narrative   Work or School: none, retired, worked at [employer] for 25 years       Home Situation: lives with mother and husband      Spiritual Beliefs: methodist, christian      Lifestyle: does bowling every Wednesday, no regular exercise otherwise, diet is not great - husband is active and exercises             The patient has a family history of  3.) Review of functional ability and level of safety:  Any difficulty hearing?  NO  History of falling?  NO  Any trouble with IADLs - using a phone, using transportation, grocery shopping, preparing meals, doing housework, doing laundry, taking medications and managing money? NO  Advance Directives? YES   See summary of recommendations in Patient Instructions below.  4.) Physical Exam Filed Vitals:   09/27/13 0926  BP: 124/86  Pulse: 95  Temp: 98 F (36.7 C)   Estimated body mass index is 25.58 kg/(m^2) as calculated from the following:   Height as of this encounter: 5' 6.75" (1.695 m).  Weight as of this encounter: 162 lb (73.483 kg).  EKG (optional): deferred  General: alert, appear well hydrated and in no acute distress  HEENT: visual acuity grossly intact  CV: HRRR  Lungs: CTA bilaterally  Psych: pleasant and cooperative, no obvious depression or anxiety  Mini Cog: 1. Patient instructed to listen carefully and repeat the following: [place]  2. Clock drawing test was administered: NORMAL     3. Recall of three words:3/3  Scoring:  Patient Score: NEG    MS: -normal gain, no abnormalities on inspection hips and legs -no TTP or pain with int/external rotation of hip or  compression -normal inpsection and palpation of R knee, neg lachman, neg val/var stress, no jt line TTP -NV intact distal  See patient instructions for recommendations.  Education and counseling regarding the above review of health provided with a plan for the following: -see scanned patient completed form for further details -fall prevention strategies discussed  -healthy lifestyle discussed -importance and resources for completing advanced directives discussed -see patient instructions below for any other recommendations provided  4)The following written screening schedule of preventive measures were reviewed with assessment and plan made per below, orders and patient instructions:      AAA screening:     Alcohol screening     Obesity Screening and counseling     STI screening     Tobacco Screening       Pneumococcal (PPSV23 -one dose after 64, one before if risk factors), influenza yearly and hepatitis B vaccines (if high risk - end stage renal disease, IV drugs, homosexual men, live in home for mentally retarded, hemophilia receiving factors) ASSESSMENT/PLAN:      Screening mammograph (yearly if >40) ASSESSMENT/PLAN: with gyn      Screening Pap smear/pelvic exam (q2 years) ASSESSMENT/PLAN: with gyn      Prostate cancer screening ASSESSMENT/PLAN: n/a      Colorectal cancer screening (FOBT yearly or flex sig q4y or colonoscopy q10y or barium enema q4y) ASSESSMENT/PLAN: opted for FOBT, stool cards and instructions given, screening recs discussed      Diabetes outpatient self-management training services ASSESSMENT/PLAN: n/a      Bone mass measurements(covered q2y if indicated - estrogen def, osteoporosis, hyperparathyroid, vertebral abnormalities, osteoporosis or steroids) ASSESSMENT/PLAN: sees gyn      Screening for glaucoma(q1y if high risk - diabetes, FH, AA and > 50 or hispanic and > 65) ASSESSMENT/PLAN: she will schedule eye exam though no issues      Medical  nutritional therapy for individuals with diabetes or renal disease ASSESSMENT/PLAN: n/a      Cardiovascular screening blood tests (lipids q5y) ASSESSMENT/PLAN:      Diabetes screening tests ASSESSMENT/PLAN:  No family or personal hx of breast cancer or ovarian cancer - seeing gyn for yearly female health assessment ands mamograms, paps and bone health  7.) Summary: -risk factors and conditions per above assessment were discussed and treatment, recommendations and referrals were offered per documentation above and orders and patient instructions.  Declined Flu vaccine  Declined Zostavax  Declined Pneumococcal   Medicare annual wellness visit, initial  HYPERTENSION - Plan: Basic metabolic panel -continue medications  Tobacco use -advised to quit, risks and options for help with quiting, motivation discussed for >3<10 inutes -she does not want to quit and understands risks -advised good mouth and neck exam yearly at least with dentist, discussed lung cancer screening  Leg pain, anterior, right -normal exam, resoved, rtc if recurs or persists  or new concerns  Other and unspecified hyperlipidemia - Plan: Lipid panel -lifestyle recs Patient Instructions  -follow up with your gynecologist yearly for womens health  -complete and return stool cards per instructions  -we advise that you quit smoking, let us know if we can help  -We recommend the following healthy lifestyle measures: - eat a healthy diet consisting of lots of vegetables, fruits, beans, nuts, seeds, healthy meats such as white chicken and fish and whole grains.  - avoid fried foods, fast food, processed foods, sodas, red meet and other fattening foods.  - get a least 150 minutes of aerobic exercise per week.   -We have ordered labs or studies at this visit. It can take up to 1-2 weeks for results and processing. We will contact you with instructions IF your results are abnormal. Normal results will be released to  your [facility]. If you have not heard from [place] or can not find your results in [facility] in 2 weeks please contact our office.   Follow up in 6 months and/or as needed

## 2013-09-27 NOTE — Progress Notes (Signed)
Pre visit review using our clinic review tool, if applicable. No additional management support is needed unless otherwise documented below in the visit note. 

## 2013-09-27 NOTE — Patient Instructions (Signed)
-  follow up with your gynecologist yearly for womens health  -complete and return stool cards per instructions  -we advise that you quit smoking, let us know if we can help  -We recommend the following healthy lifestyle measures: - eat a healthy diet consisting of lots of vegetables, fruits, beans, nuts, seeds, healthy meats such as white chicken and fish and whole grains.  - avoid fried foods, fast food, processed foods, sodas, red meet and other fattening foods.  - get a least 150 minutes of aerobic exercise per week.   -We have ordered labs or studies at this visit. It can take up to 1-2 weeks for results and processing. We will contact you with instructions IF your results are abnormal. Normal results will be released to your [facility]. If you have not heard from [place] or can not find your results in [facility] in 2 weeks please contact our office.   Follow up in 6 months and/or as needed

## 2013-09-27 NOTE — Addendum Note (Signed)
Addended by: Agnes Lawrence on: 09/27/2013 03:15 PM   Modules accepted: Orders

## 2014-04-01 ENCOUNTER — Encounter: Payer: Self-pay | Admitting: Family Medicine

## 2014-04-01 ENCOUNTER — Ambulatory Visit (INDEPENDENT_AMBULATORY_CARE_PROVIDER_SITE_OTHER): Payer: Medicare Other | Admitting: Family Medicine

## 2014-04-01 VITALS — BP 158/100 | HR 96 | Temp 97.9°F | Ht 66.75 in | Wt 167.8 lb

## 2014-04-01 DIAGNOSIS — E785 Hyperlipidemia, unspecified: Secondary | ICD-10-CM

## 2014-04-01 DIAGNOSIS — M545 Low back pain: Secondary | ICD-10-CM

## 2014-04-01 DIAGNOSIS — I10 Essential (primary) hypertension: Secondary | ICD-10-CM | POA: Diagnosis not present

## 2014-04-01 DIAGNOSIS — Z720 Tobacco use: Secondary | ICD-10-CM

## 2014-04-01 MED ORDER — HYDROCHLOROTHIAZIDE 25 MG PO TABS
25.0000 mg | ORAL_TABLET | Freq: Every day | ORAL | Status: DC
Start: 2014-04-01 — End: 2015-07-23

## 2014-04-01 MED ORDER — PRAVASTATIN SODIUM 40 MG PO TABS: 40.0000 mg | ORAL_TABLET | Freq: Every day | ORAL | Status: DC

## 2014-04-01 MED ORDER — BENAZEPRIL HCL 20 MG PO TABS: 20.0000 mg | ORAL_TABLET | Freq: Every day | ORAL | Status: DC

## 2014-04-01 MED ORDER — AMLODIPINE BESYLATE 10 MG PO TABS: 10.0000 mg | ORAL_TABLET | Freq: Every day | ORAL | Status: DC

## 2014-04-01 MED ORDER — TIZANIDINE HCL 2 MG PO TABS: 2.0000 mg | ORAL_TABLET | Freq: Four times a day (QID) | ORAL | Status: DC | PRN

## 2014-04-01 NOTE — Addendum Note (Signed)
Addended by: [name] on: 04/01/2014 03:59 PM   Modules accepted: Orders

## 2014-04-01 NOTE — Progress Notes (Signed)
HPI:  Back Pain: -started 3 days ago after helping to lift mother into bed -bilateral low back pain, 6/10 achy pain -worse with rolling over or when goes to stand up - better after moving around, eases off -ibuprofen and heat help a little -denies: fevers, malaise, weakness, numbness, bowel or bladder incontinence   ROS: See pertinent positives and negatives per HPI.  Past Medical History  Diagnosis Date  . Hypertension   . GERD (gastroesophageal reflux disease)     Past Surgical History  Procedure Laterality Date  . Breast surgery      benign cyst removal    Family History  Problem Relation Age of Onset  . Stroke Mother   . Hypertension Mother   . Cancer Sister 41    'stomach cancer'     History   Social History  . Marital Status: Married    Spouse Name: N/A  . Number of Children: N/A  . Years of Education: N/A   Social History Main Topics  . Smoking status: Current Every Day Smoker -- 0.80 packs/day    Types: Cigarettes  . Smokeless tobacco: Not on file     Comment: per patient not even a pack a day; takes 3 puffs and puts it out   . Alcohol Use: Yes     Comment: occ   . Drug Use: Not on file  . Sexual Activity: Not on file   Other Topics Concern  . None   Social History Narrative   Work or School: none, retired, worked at [employer] for 25 years       Home Situation: lives with mother and husband      Spiritual Beliefs: methodist, christian      Lifestyle: does bowling every Wednesday, no regular exercise otherwise, diet is not great - husband is active and exercises              Current outpatient prescriptions:  .  amLODipine (NORVASC) 10 MG tablet, Take 1 tablet (10 mg total) by mouth daily., Disp: 90 tablet, Rfl: 1 .  benazepril (LOTENSIN) 20 MG tablet, Take 1 tablet (20 mg total) by mouth daily., Disp: 90 tablet, Rfl: 1 .  hydrochlorothiazide (HYDRODIURIL) 25 MG tablet, Take 1 tablet (25 mg total) by mouth daily., Disp: 90 tablet, Rfl: 1 .   pravastatin (PRAVACHOL) 40 MG tablet, Take 1 tablet (40 mg total) by mouth daily., Disp: 90 tablet, Rfl: 1  EXAM:  Filed Vitals:   04/01/14 1530  BP: 158/100  Pulse: 96  Temp: 97.9 F (36.6 C)    Body mass index is 26.49 kg/(m^2).  GENERAL: vitals reviewed and listed above, alert, oriented, appears well hydrated - stands for HPI  HEENT: atraumatic, conjunttiva clear, no obvious abnormalities on inspection of external nose and ears  NECK: no obvious masses on inspection  MS: moves all extremities without noticeable abnormality Normal Gait Normal inspection of back, no obvious scoliosis or leg length descrepancy No bony TTP Soft tissue TTP at: R lower lumbar and R PSIS -/+ tests: neg trendelenburg,-facet loading, -SLRT, -CLRT, -FABER, -FADIR Normal muscle strength, sensation to light touch and DTRs in LEs bilaterally  PSYCH: pleasant and cooperative, no obvious depression or anxiety  ASSESSMENT AND PLAN:  Discussed the following assessment and plan:  Low back pain without sciatica, unspecified back pain laterality -suspect muscular but DDD and R sacroiliitis also possibilities -opted given exam ok HPI without alarm features to do conservative tx and HEP -close follow up in 3-4 weeks,  sooner if worsening or concerns  Tobacco use -advised to quit  Essential hypertension -up today while in pain and standing, recheck at follow up  -Patient advised to return or notify a doctor immediately if symptoms worsen or persist or new concerns arise.  Patient Instructions  BEFORE YOU LEAVE: -low back exercises -follow up in 1 month or sooner if needed  Tylenol 500-1000mg  up to 3 times daily  Heat 15 minutes twice daily  Do the Exercises 3-4 days per week  Muscle relaxer nightly to 5-7 days as needed     Colin Benton R.

## 2014-04-01 NOTE — Progress Notes (Signed)
Pre visit review using our clinic review tool, if applicable. No additional management support is needed unless otherwise documented below in the visit note. 

## 2014-04-01 NOTE — Patient Instructions (Signed)
BEFORE YOU LEAVE: -low back exercises -follow up in 1 month or sooner if needed  Tylenol 500-1000mg  up to 3 times daily  Heat 15 minutes twice daily  Do the Exercises 3-4 days per week  Muscle relaxer nightly to 5-7 days as needed

## 2014-04-02 ENCOUNTER — Telehealth: Payer: Self-pay | Admitting: Family Medicine

## 2014-04-02 NOTE — Telephone Encounter (Signed)
emmi emailed °

## 2014-04-29 ENCOUNTER — Ambulatory Visit (INDEPENDENT_AMBULATORY_CARE_PROVIDER_SITE_OTHER): Payer: Self-pay | Admitting: Family Medicine

## 2014-04-29 DIAGNOSIS — R69 Illness, unspecified: Secondary | ICD-10-CM

## 2014-04-29 NOTE — Progress Notes (Signed)
Patient was a NO SHOW for her appointment today. Will have assistant call her to ensure rescheduled to follow up on HTN.  F/u Up Back Pain: -started 03/29/14 ago after helping to lift mother into bed -started out as bilateral low back pain, 6/10 achy pain that was worse with rolling over and standing up and better after moving around -treated with heat, HEP, muscle relaxer, analgesics 04/01/14  HTN: -elevated last visit - was in pain -she wanted to recheck once back feeling better

## 2014-06-10 ENCOUNTER — Encounter: Payer: Self-pay | Admitting: *Deleted

## 2015-07-04 ENCOUNTER — Ambulatory Visit (INDEPENDENT_AMBULATORY_CARE_PROVIDER_SITE_OTHER): Payer: Medicare Other | Admitting: Physician Assistant

## 2015-07-04 VITALS — BP 120/76 | HR 79 | Temp 97.9°F | Resp 17 | Ht 67.0 in | Wt 170.0 lb

## 2015-07-04 DIAGNOSIS — L200 Besnier's prurigo: Secondary | ICD-10-CM

## 2015-07-04 DIAGNOSIS — R21 Rash and other nonspecific skin eruption: Secondary | ICD-10-CM

## 2015-07-04 DIAGNOSIS — L309 Dermatitis, unspecified: Secondary | ICD-10-CM

## 2015-07-04 LAB — POCT CBC
Granulocyte percent: 53.8 %G (ref 37–80)
HCT, POC: 36.7 % — AB (ref 37.7–47.9)
HEMOGLOBIN: 12.7 g/dL (ref 12.2–16.2)
Lymph, poc: 2.2 (ref 0.6–3.4)
MCH: 27.9 pg (ref 27–31.2)
MCHC: 34.7 g/dL (ref 31.8–35.4)
MCV: 80.6 fL (ref 80–97)
MID (cbc): 0.4 (ref 0–0.9)
MPV: 6.7 fL (ref 0–99.8)
POC Granulocyte: 3 (ref 2–6.9)
POC LYMPH PERCENT: 39.6 %L (ref 10–50)
POC MID %: 6.6 %M (ref 0–12)
Platelet Count, POC: 288 10*3/uL (ref 142–424)
RBC: 4.56 M/uL (ref 4.04–5.48)
RDW, POC: 15.1 %
WBC: 5.6 10*3/uL (ref 4.6–10.2)

## 2015-07-04 MED ORDER — TRIAMCINOLONE ACETONIDE 0.5 % EX OINT: 1.0000 "application " | TOPICAL_OINTMENT | Freq: Two times a day (BID) | CUTANEOUS | Status: DC

## 2015-07-04 NOTE — Progress Notes (Signed)
07/04/2015 11:44 AM   DOB: 17-Nov-1945 / MRN: [id number]  SUBJECTIVE:  Alexandra Flores is a 70 y.o. female with a history of eczema presenting for a rash to the posterior right arm and shoulder. This started 7 days ago and is getting somewhat better.  Reports that it does itch.  She denies fever, pain, nausea.  She has placed calamine on the rash and this has not really helped.    She has No Known Allergies.   She  has a past medical history of Hypertension and GERD (gastroesophageal reflux disease).    She  reports that she has been smoking Cigarettes.  She has a 38.4 pack-year smoking history. She does not have any smokeless tobacco history on file. She reports that she drinks alcohol. She  has no sexual activity history on file. The patient  has past surgical history that includes Breast surgery.  Her family history includes Cancer (age of onset: 97) in her sister; Hypertension in her mother; Stroke in her mother.  Review of Systems  Skin: Positive for itching and rash.    Problem list and medications reviewed and updated by myself where necessary, and exist elsewhere in the encounter.   OBJECTIVE:  BP 120/76 mmHg  Pulse 79  Temp(Src) 97.9 F (36.6 C) (Other (Comment))  Resp 17  Ht 5\' 7"  (1.702 m)  Wt 170 lb (77.111 kg)  BMI 26.62 kg/m2  SpO2 99%  Physical Exam  Constitutional: She is oriented to person, place, and time. She appears well-developed and well-nourished. No distress.  Cardiovascular: Normal rate and regular rhythm.   Pulmonary/Chest: Effort normal and breath sounds normal.  Musculoskeletal: Normal range of motion.       Arms: Neurological: She is alert and oriented to person, place, and time.  Skin: She is not diaphoretic.  Vitals reviewed.   Lab Results  Component Value Date   HGBA1C 5.8 07/07/2012      Results for orders placed or performed in visit on 07/04/15 (from the past 72 hour(s))  POCT CBC     Status: Abnormal   Collection Time: 07/04/15  11:33 AM  Result Value Ref Range   WBC 5.6 4.6 - 10.2 K/uL   Lymph, poc 2.2 0.6 - 3.4   POC LYMPH PERCENT 39.6 10 - 50 %L   MID (cbc) 0.4 0 - 0.9   POC MID % 6.6 0 - 12 %M   POC Granulocyte 3.0 2 - 6.9   Granulocyte percent 53.8 37 - 80 %G   RBC 4.56 4.04 - 5.48 M/uL   Hemoglobin 12.7 12.2 - 16.2 g/dL   HCT, POC 36.7 (A) 37.7 - 47.9 %   MCV 80.6 80 - 97 fL   MCH, POC 27.9 27 - 31.2 pg   MCHC 34.7 31.8 - 35.4 g/dL   RDW, POC 15.1 %   Platelet Count, POC 288 142 - 424 K/uL   MPV 6.7 0 - 99.8 fL    No results found.  ASSESSMENT AND PLAN  Taiylor was seen today for poison ivy.  Diagnoses and all orders for this visit:  Eczema: Given duration shingles unlikely.  She has dry skin elsewhere that resembles this rash, however less severe.  CBC reassuring.  -     triamcinolone ointment (KENALOG) 0.5 %; Apply 1 application topically 2 (two) times daily.  Rash and nonspecific skin eruption -     POCT CBC    The patient was advised to call or return to clinic if  she does not see an improvement in symptoms or to seek the care of the closest emergency department if she worsens with the above plan.   Philis Fendt, MHS, PA-C [facility] 07/04/2015 11:44 AM

## 2015-07-04 NOTE — Patient Instructions (Signed)
     IF you received an x-ray today, you will receive an invoice from [facility]. Please contact [facility] at [phone] with questions or concerns regarding your invoice.   IF you received labwork today, you will receive an invoice from [facility]. Please contact [facility] at [phone] with questions or concerns regarding your invoice.   Our billing staff will not be able to assist you with questions regarding bills from these companies.  You will be contacted with the lab results as soon as they are available. The fastest way to get your results is to activate your My Chart account. Instructions are located on the last page of this paperwork. If you have not heard from us regarding the results in 2 weeks, please contact this office.     We recommend that you schedule a mammogram for breast cancer screening. Typically, you do not need a referral to do this. Please contact a local imaging center to schedule your mammogram.  [facility] - [phone]  *ask for the Radiology Department [facility]) - [phone] or [phone]  [facility] - [phone] [facility] - [phone] [facility] - [phone]  *ask for the Radiology Department [facility] - [phone]  *ask for the Radiology Department [facility] - [phone]  *ask for the Mammography Department [facility] - [phone]  

## 2015-07-23 ENCOUNTER — Other Ambulatory Visit: Payer: Self-pay | Admitting: Family Medicine

## 2015-07-23 DIAGNOSIS — I10 Essential (primary) hypertension: Secondary | ICD-10-CM

## 2015-07-23 MED ORDER — AMLODIPINE BESYLATE 10 MG PO TABS: 10.0000 mg | ORAL_TABLET | Freq: Every day | ORAL | Status: DC

## 2015-07-23 MED ORDER — BENAZEPRIL HCL 20 MG PO TABS: 20.0000 mg | ORAL_TABLET | Freq: Every day | ORAL | Status: DC

## 2015-07-23 MED ORDER — HYDROCHLOROTHIAZIDE 25 MG PO TABS: 25.0000 mg | ORAL_TABLET | Freq: Every day | ORAL | Status: DC

## 2015-07-23 NOTE — Telephone Encounter (Signed)
Rx's sent to pharmacy as requested. Pt scheduled for CPE in Sept.

## 2015-07-23 NOTE — Telephone Encounter (Signed)
Pt need new Rx for amlodipine, benazepril and hydrochlorothiazide      [facility] [address]

## 2015-09-29 NOTE — Progress Notes (Signed)
Medicare Annual Preventive Care Visit  (initial annual wellness or annual wellness exam) Not in for a visit or for follow up in > 1 year. On review of chart, no show for last visit. Reports sees gynecologist yearly for Womens's health and exam. Concerns and/or follow up today:  1)HTN: -meds: norvasc 10, benazepril 20, hctz 25; reports was able to get refill and takes daily -denies: CP, SOB, swelling, palpitation  2)HLD: -takes pravastatin 20mg  - but reports has not taken in about 3 months -stable  3)Tobacco Use: -counseled many time int the past -really enjoys, has increased to 1 ppd , > 21 py history -she reports she understands risks but does not want to quit currently -denies: SOB, cough, hemoptysis  4) Overweight: -wt increasing -admits to very poor diet - several sodas daily and lots of sugar in coffee -no exercise - used to walk and admits needs to get back to walking  ROS: negative for report of fevers, unintentional weight loss, vision changes, vision loss, hearing loss or change, chest pain, sob, hemoptysis, melena, hematochezia, hematuria, genital discharge or lesions, falls, bleeding or bruising, loc, thoughts of suicide or self harm, memory loss  1.) Patient-completed health risk assessment  - completed and reviewed, see scanned documentation  2.) Review of Medical History: -PMH, PSH, Family History and current specialty and care providers reviewed and updated and listed below  - see scanned in document in chart and below  Past Medical History:  Diagnosis Date  . GERD (gastroesophageal reflux disease)   . Hypertension     Past Surgical History:  Procedure Laterality Date  . BREAST SURGERY     benign cyst removal    Social History   Social History  . Marital status: Married    Spouse name: N/A  . Number of children: N/A  . Years of education: N/A   Occupational History  . Not on file.   Social History Main Topics  . Smoking status: Current Every  Day Smoker    Packs/day: 0.80    Years: 48.00    Types: Cigarettes  . Smokeless tobacco: Not on file     Comment: per patient not even a pack a day; takes 3 puffs and puts it out   . Alcohol use 0.0 oz/week     Comment: occ   . Drug use: Unknown  . Sexual activity: Not on file   Other Topics Concern  . Not on file   Social History Narrative   Work or School: none, retired, worked at [employer] for 25 years       Home Situation: lives with husband; mother passed in 2017      Spiritual Beliefs: methodist, christian      Lifestyle: does bowling every Wednesday, no regular exercise otherwise, diet is not great - husband is active and exercises             Family History  Problem Relation Age of Onset  . Stroke Mother   . Hypertension Mother   . Cancer Sister 74    'stomach cancer'     Current Outpatient Prescriptions on File Prior to Visit  Medication Sig Dispense Refill  . tiZANidine (ZANAFLEX) 2 MG tablet Take 1 tablet (2 mg total) by mouth every 6 (six) hours as needed for muscle spasms. 20 tablet 0  . triamcinolone ointment (KENALOG) 0.5 % Apply 1 application topically 2 (two) times daily. 30 g 0   No current facility-administered medications on file prior to visit.  3.) Review of functional ability and level of safety:  Any difficulty hearing?  NO  History of falling?  NO  Any trouble with IADLs - using a phone, using transportation, grocery shopping, preparing meals, doing housework, doing laundry, taking medications and managing money? NO  Advance Directives? Yes, copy requested  See summary of recommendations in Patient Instructions below.  4.) Physical Exam Vitals:   09/30/15 0831  BP: 136/86  Pulse: 95  Temp: 97.8 F (36.6 C)   Estimated body mass index is 27.41 kg/m as calculated from the following:   Height as of this encounter: 5' 6.25" (1.683 m).   Weight as of this encounter: 171 lb 1.6 oz (77.6 kg).  EKG (optional):  deferred  General: alert, appear well hydrated and in no acute distress  HEENT: visual acuity grossly intact  CV: HRRR, no LE edema  Lungs: CTA bilaterally  Psych: pleasant and cooperative, no obvious depression or anxiety  Cognitive function grossly intact  See patient instructions for recommendations.  Education and counseling regarding the above review of health provided with a plan for the following: -see scanned patient completed form for further details -fall prevention strategies discussed  -healthy lifestyle discussed -importance and resources for completing advanced directives discussed -see patient instructions below for any other recommendations provided  4)The following written screening schedule of preventive measures were reviewed with assessment and plan made per below, orders and patient instructions:      AAA screening done if applicable     Alcohol screening done     Obesity Screening and counseling done     STI screening (Hep C if born 1943-65) offered and per pt wishes     Tobacco Screening done        Pneumococcal (PPSV23 -one dose after 64, one before if risk factors), influenza yearly and hepatitis B vaccines (if high risk - end stage renal disease, IV drugs, homosexual men, live in home for mentally retarded, hemophilia receiving factors) ASSESSMENT/PLAN: refuses all vaccines      Screening mammograph (yearly if >40) ASSESSMENT/PLAN: she agrees to schedule, refused to allow [place] to place order/schedule      Screening Pap smear/pelvic exam (q2 years) ASSESSMENT/PLAN: n/a, declined - reported she does with her gynecologist      Colorectal cancer screening (FOBT yearly or flex sig q4y or colonoscopy q10y or barium enema q4y) ASSESSMENT/PLAN: discussed options, she asked [place] to place order for cologuard, instructed assistant to order      Diabetes outpatient self-management training services ASSESSMENT/PLAN: n/a      Bone mass measurements(covered q2y  if indicated - estrogen def, osteoporosis, hyperparathyroid, vertebral abnormalities, osteoporosis or steroids) ASSESSMENT/PLAN: advised, pt refused screening; advised wt bearing exercise, vit d3 [phone] IU (safety/quality issues discussed), adequat dietary calcium      Screening for glaucoma(q1y if high risk - diabetes, FH, AA and > 50 or hispanic and > 65) ASSESSMENT/PLAN: utd or advised      Medical nutritional therapy for individuals with diabetes or renal disease ASSESSMENT/PLAN: n/a      Cardiovascular screening blood tests (lipids q5y) ASSESSMENT/PLAN: see orders and labs      Diabetes screening tests ASSESSMENT/PLAN: see orders and labs   7.) Summary:  Medicare annual wellness visit, subsequent - Plan: Hep C Antibody -risk factors and conditions per above assessment were discussed and treatment, recommendations and referrals were offered per documentation above and orders and patient instructions. -all recommended preventive care measures discussed/reviewed; she does refused most -advised assistant to  order cologuard -pt declined to allow [place] to set up mammogram, but she agrees to do so  Essential hypertension - Plan: Basic metabolic panel, CBC (no diff), hydrochlorothiazide (HYDRODIURIL) 25 MG tablet, benazepril (LOTENSIN) 20 MG tablet, amLODipine (NORVASC) 10 MG tablet -BP improved on recheck -advised smoking cessation, weight reduction, healthy diet, regular exercise and continuation of medications -labs today -medication refills provided  Tobacco use -advised to quit and counseled - pt not ready to quit and refused  Hyperlipemia - Plan: Lipid Panel, pravastatin (PRAVACHOL) 40 MG tablet -labs, advised if lipids elevated to restart statin and let us know if she prefers an rx for a different statin  Elevated BMI: -advised at length on healthy lifestyle, see pt instructions -goal for next visit to reduce sweets by eliminating sodas and replacing with water, replacing  sugar in coffee with stevia, increasing exercise  Patient Instructions  BEFORE YOU LEAVE: -follow up: 4 months -order cologuard test -labs  Please call today to schedule your mammogram.  Please cut sugary beverages out of your diet. Change sodas to water. Can put Stevia in coffee for sweetness.  Get 20-30 minutes of exercise daily.  To help keep your bones strong take Vit D3 [phone] IU daily, eat a healthy diet with adequate calcium (1200mg  daily) and get regular weight bearing exercise.  Please bring a copy of your bone density test to your next appointment.  We have ordered labs or studies at this visit. It can take up to 1-2 weeks for results and processing. IF results require follow up or explanation, we will call you with instructions. Clinically stable results will be released to your [facility]. If you have not heard from [place] or cannot find your results in [facility] in 2 weeks please contact our office at [phone].  If you are not yet signed up for [facility], please consider signing up.   We recommend the following healthy lifestyle for LIFE: 1) Small portions.   Tip: eat off of a salad plate instead of a dinner plate.  Tip: It is ok to feel hungry after a meal - that likely means you ate an appropriate portion.  Tip: if you need more or a snack choose fruits, veggies and/or a handful of nuts or seeds.  2) Eat a healthy clean diet.  * Tip: Avoid (less then 1 serving per week): processed foods, sweets, sweetened drinks, white starches (rice, flour, bread, potatoes, pasta, etc), red meat, fast foods, butter  *Tip: CHOOSE instead   * 5-9 servings per day of fresh or frozen fruits and vegetables (but not corn, potatoes, bananas, canned or dried fruit)   *nuts and seeds, beans   *olives and olive oil   *small portions of lean meats such as fish and white chicken    *small portions of whole grains  3)Get at least 150 minutes of sweaty aerobic exercise per week.  4)Reduce stress -  consider counseling, meditation and relaxation to balance other aspects of your life.     Colin Benton R., DO

## 2015-09-30 ENCOUNTER — Ambulatory Visit (INDEPENDENT_AMBULATORY_CARE_PROVIDER_SITE_OTHER): Payer: Medicare Other | Admitting: Family Medicine

## 2015-09-30 ENCOUNTER — Encounter: Payer: Self-pay | Admitting: Family Medicine

## 2015-09-30 VITALS — BP 136/86 | HR 95 | Temp 97.8°F | Ht 66.25 in | Wt 171.1 lb

## 2015-09-30 DIAGNOSIS — Z720 Tobacco use: Secondary | ICD-10-CM

## 2015-09-30 DIAGNOSIS — E785 Hyperlipidemia, unspecified: Secondary | ICD-10-CM | POA: Insufficient documentation

## 2015-09-30 DIAGNOSIS — I10 Essential (primary) hypertension: Secondary | ICD-10-CM | POA: Diagnosis not present

## 2015-09-30 DIAGNOSIS — Z0000 Encounter for general adult medical examination without abnormal findings: Secondary | ICD-10-CM | POA: Diagnosis not present

## 2015-09-30 DIAGNOSIS — Z6827 Body mass index (BMI) 27.0-27.9, adult: Secondary | ICD-10-CM | POA: Diagnosis not present

## 2015-09-30 HISTORY — DX: Hyperlipidemia, unspecified: E78.5

## 2015-09-30 LAB — CBC
HCT: 39.8 % (ref 36.0–46.0)
Hemoglobin: 13.2 g/dL (ref 12.0–15.0)
MCHC: 33.2 g/dL (ref 30.0–36.0)
MCV: 82.6 fl (ref 78.0–100.0)
Platelets: 295 10*3/uL (ref 150.0–400.0)
RBC: 4.82 Mil/uL (ref 3.87–5.11)
RDW: 15.1 % (ref 11.5–15.5)
WBC: 6 10*3/uL (ref 4.0–10.5)

## 2015-09-30 LAB — BASIC METABOLIC PANEL
BUN: 10 mg/dL (ref 6–23)
CALCIUM: 9.7 mg/dL (ref 8.4–10.5)
CO2: 30 meq/L (ref 19–32)
CREATININE: 0.68 mg/dL (ref 0.40–1.20)
Chloride: 103 mEq/L (ref 96–112)
GFR: 110.69 mL/min (ref >60.00)
Glucose, Bld: 96 mg/dL (ref 70–99)
Potassium: 3.4 mEq/L — ABNORMAL LOW (ref 3.5–5.1)
Sodium: 139 mEq/L (ref 135–145)

## 2015-09-30 LAB — LIPID PANEL
CHOL/HDL RATIO: 7
Cholesterol: 218 mg/dL — ABNORMAL HIGH (ref 0–200)
HDL: 29.2 mg/dL — AB (ref >39.00)
LDL Cholesterol: 153 mg/dL — ABNORMAL HIGH (ref 0–99)
NONHDL: 188.9
Triglycerides: 180 mg/dL — ABNORMAL HIGH (ref 0.0–149.0)
VLDL: 36 mg/dL (ref 0.0–40.0)

## 2015-09-30 MED ORDER — AMLODIPINE BESYLATE 10 MG PO TABS: 10.0000 mg | ORAL_TABLET | Freq: Every day | ORAL | 1 refills | Status: DC

## 2015-09-30 MED ORDER — PRAVASTATIN SODIUM 40 MG PO TABS: 40.0000 mg | ORAL_TABLET | Freq: Every day | ORAL | 1 refills | Status: DC

## 2015-09-30 MED ORDER — BENAZEPRIL HCL 20 MG PO TABS: 20.0000 mg | ORAL_TABLET | Freq: Every day | ORAL | 1 refills | Status: DC

## 2015-09-30 MED ORDER — HYDROCHLOROTHIAZIDE 25 MG PO TABS: 25.0000 mg | ORAL_TABLET | Freq: Every day | ORAL | 1 refills | Status: DC

## 2015-09-30 NOTE — Progress Notes (Signed)
Pre visit review using our clinic review tool, if applicable. No additional management support is needed unless otherwise documented below in the visit note. 

## 2015-09-30 NOTE — Patient Instructions (Signed)
BEFORE YOU LEAVE: -follow up: 4 months -order cologuard test -labs  Please call today to schedule your mammogram.  Please cut sugary beverages out of your diet. Change sodas to water. Can put Stevia in coffee for sweetness.  Get 20-30 minutes of exercise daily.  To help keep your bones strong take Vit D3 [phone] IU daily, eat a healthy diet with adequate calcium (1200mg  daily) and get regular weight bearing exercise.  Please bring a copy of your bone density test to your next appointment.  We have ordered labs or studies at this visit. It can take up to 1-2 weeks for results and processing. IF results require follow up or explanation, we will call you with instructions. Clinically stable results will be released to your [facility]. If you have not heard from [place] or cannot find your results in [facility] in 2 weeks please contact our office at [phone].  If you are not yet signed up for [facility], please consider signing up.   We recommend the following healthy lifestyle for LIFE: 1) Small portions.   Tip: eat off of a salad plate instead of a dinner plate.  Tip: It is ok to feel hungry after a meal - that likely means you ate an appropriate portion.  Tip: if you need more or a snack choose fruits, veggies and/or a handful of nuts or seeds.  2) Eat a healthy clean diet.  * Tip: Avoid (less then 1 serving per week): processed foods, sweets, sweetened drinks, white starches (rice, flour, bread, potatoes, pasta, etc), red meat, fast foods, butter  *Tip: CHOOSE instead   * 5-9 servings per day of fresh or frozen fruits and vegetables (but not corn, potatoes, bananas, canned or dried fruit)   *nuts and seeds, beans   *olives and olive oil   *small portions of lean meats such as fish and white chicken    *small portions of whole grains  3)Get at least 150 minutes of sweaty aerobic exercise per week.  4)Reduce stress - consider counseling, meditation and relaxation to balance other aspects of  your life.

## 2015-10-01 LAB — HEPATITIS C ANTIBODY: HCV Ab: NEGATIVE

## 2015-10-03 MED ORDER — ATORVASTATIN CALCIUM 20 MG PO TABS: 20.0000 mg | ORAL_TABLET | Freq: Every day | ORAL | 3 refills | Status: DC

## 2015-10-03 NOTE — Addendum Note (Signed)
Addended by: Agnes Lawrence on: 10/03/2015 10:11 AM   Modules accepted: Orders

## 2015-10-21 ENCOUNTER — Other Ambulatory Visit: Payer: Self-pay | Admitting: Family Medicine

## 2015-10-21 DIAGNOSIS — I10 Essential (primary) hypertension: Secondary | ICD-10-CM

## 2015-12-26 ENCOUNTER — Encounter: Payer: Self-pay | Admitting: Family Medicine

## 2015-12-26 ENCOUNTER — Ambulatory Visit (INDEPENDENT_AMBULATORY_CARE_PROVIDER_SITE_OTHER): Payer: Medicare Other | Admitting: Family Medicine

## 2015-12-26 VITALS — BP 126/88 | HR 90 | Temp 98.7°F | Ht 66.25 in | Wt 174.8 lb

## 2015-12-26 DIAGNOSIS — J988 Other specified respiratory disorders: Secondary | ICD-10-CM | POA: Diagnosis not present

## 2015-12-26 DIAGNOSIS — Z720 Tobacco use: Secondary | ICD-10-CM | POA: Diagnosis not present

## 2015-12-26 DIAGNOSIS — I10 Essential (primary) hypertension: Secondary | ICD-10-CM | POA: Diagnosis not present

## 2015-12-26 DIAGNOSIS — R591 Generalized enlarged lymph nodes: Secondary | ICD-10-CM

## 2015-12-26 MED ORDER — AMOXICILLIN 500 MG PO CAPS: 500.0000 mg | ORAL_CAPSULE | Freq: Two times a day (BID) | ORAL | 0 refills | Status: DC

## 2015-12-26 NOTE — Progress Notes (Signed)
HPI:  Acute visit for:  Swelling under R ear: -has had some sinus congestion and PND, then noticed gland swollen -no fevers, sob, malaise, ear pain, drainage from ear -smoker -bp elevated on arrival - she reports anxious abou this  ROS: See pertinent positives and negatives per HPI.  Past Medical History:  Diagnosis Date  . GERD (gastroesophageal reflux disease)   . Hypertension     Past Surgical History:  Procedure Laterality Date  . BREAST SURGERY     benign cyst removal    Family History  Problem Relation Age of Onset  . Stroke Mother   . Hypertension Mother   . Cancer Sister 20    'stomach cancer'     Social History   Social History  . Marital status: Married    Spouse name: N/A  . Number of children: N/A  . Years of education: N/A   Social History Main Topics  . Smoking status: Current Every Day Smoker    Packs/day: 0.80    Years: 48.00    Types: Cigarettes  . Smokeless tobacco: None     Comment: per patient not even a pack a day; takes 3 puffs and puts it out   . Alcohol use 0.0 oz/week     Comment: occ   . Drug use: Unknown  . Sexual activity: Not Asked   Other Topics Concern  . None   Social History Narrative   Work or School: none, retired, worked at [employer] for 25 years       Home Situation: lives with husband; mother passed in 2017      Spiritual Beliefs: methodist, christian      Lifestyle: does bowling every Wednesday, no regular exercise otherwise, diet is not great - husband is active and exercises              Current Outpatient Prescriptions:  .  amLODipine (NORVASC) 10 MG tablet, Take 1 tablet (10 mg total) by mouth daily., Disp: 90 tablet, Rfl: 1 .  amLODipine (NORVASC) 10 MG tablet, TAKE 1 TABLET (10 MG TOTAL) BY MOUTH DAILY., Disp: 90 tablet, Rfl: 1 .  atorvastatin (LIPITOR) 20 MG tablet, Take 1 tablet (20 mg total) by mouth daily., Disp: 90 tablet, Rfl: 3 .  benazepril (LOTENSIN) 20 MG tablet, Take 1 tablet (20 mg total)  by mouth daily., Disp: 90 tablet, Rfl: 1 .  hydrochlorothiazide (HYDRODIURIL) 25 MG tablet, Take 1 tablet (25 mg total) by mouth daily., Disp: 90 tablet, Rfl: 1 .  pravastatin (PRAVACHOL) 40 MG tablet, Take 1 tablet (40 mg total) by mouth daily., Disp: 90 tablet, Rfl: 1 .  tiZANidine (ZANAFLEX) 2 MG tablet, Take 1 tablet (2 mg total) by mouth every 6 (six) hours as needed for muscle spasms., Disp: 20 tablet, Rfl: 0 .  triamcinolone ointment (KENALOG) 0.5 %, Apply 1 application topically 2 (two) times daily., Disp: 30 g, Rfl: 0 .  amoxicillin (AMOXIL) 500 MG capsule, Take 1 capsule (500 mg total) by mouth 2 (two) times daily., Disp: 20 capsule, Rfl: 0  EXAM:  Vitals:   12/26/15 1443  BP: 126/88  Pulse: 90  Temp: 98.7 F (37.1 C)    Body mass index is 28 kg/m.  GENERAL: vitals reviewed and listed above, alert, oriented, appears well hydrated and in no acute distress  HEENT: atraumatic, conjunttiva clear, no obvious abnormalities on inspection of external nose and ears, normal appearance of ear canals and TMs, clear nasal congestion, mild post oropharyngeal erythema with PND,  no tonsillar edema or exudate, no sinus TTP  NECK: no obvious masses on inspection, R ant cervical LAD  LUNGS: clear to auscultation bilaterally, no wheezes, rales or rhonchi, good air movement  CV: HRRR, no peripheral edema  MS: moves all extremities without noticeable abnormality  PSYCH: pleasant and cooperative, no obvious depression or anxiety  ASSESSMENT AND PLAN:  Discussed the following assessment and plan:  Lymphadenopathy  Respiratory infection  Tobacco use  Essential hypertension  -likely URI with lymphadenopathy given acute -perfect opportunity to counsel on smoking cessation as she was worried about ca - will do amox and recheck in a few weeks - ENT eval if persist -BP better on recheck -Patient advised to return or notify a doctor immediately if symptoms worsen or persist or new  concerns arise.  Patient Instructions  Keep follow up as scheduled.  Take antibiotic as instructed.   Colin Benton R., DO

## 2015-12-26 NOTE — Patient Instructions (Signed)
Keep follow up as scheduled.  Take antibiotic as instructed.

## 2015-12-26 NOTE — Progress Notes (Signed)
Pre visit review using our clinic review tool, if applicable. No additional management support is needed unless otherwise documented below in the visit note. 

## 2016-01-30 ENCOUNTER — Ambulatory Visit (INDEPENDENT_AMBULATORY_CARE_PROVIDER_SITE_OTHER): Payer: Medicare Other | Admitting: Family Medicine

## 2016-01-30 ENCOUNTER — Encounter: Payer: Self-pay | Admitting: Family Medicine

## 2016-01-30 VITALS — BP 132/82 | HR 90 | Temp 97.9°F | Ht 66.25 in | Wt 173.7 lb

## 2016-01-30 DIAGNOSIS — Z720 Tobacco use: Secondary | ICD-10-CM | POA: Diagnosis not present

## 2016-01-30 DIAGNOSIS — I10 Essential (primary) hypertension: Secondary | ICD-10-CM | POA: Diagnosis not present

## 2016-01-30 DIAGNOSIS — E785 Hyperlipidemia, unspecified: Secondary | ICD-10-CM

## 2016-01-30 LAB — BASIC METABOLIC PANEL
BUN: 7 mg/dL (ref 6–23)
CO2: 31 meq/L (ref 19–32)
Calcium: 9.4 mg/dL (ref 8.4–10.5)
Chloride: 106 mEq/L (ref 96–112)
Creatinine, Ser: 0.57 mg/dL (ref 0.40–1.20)
GFR: 135.56 mL/min (ref >60.00)
GLUCOSE: 92 mg/dL (ref 70–99)
POTASSIUM: 3.8 meq/L (ref 3.5–5.1)
Sodium: 141 mEq/L (ref 135–145)

## 2016-01-30 LAB — LIPID PANEL
CHOLESTEROL: 164 mg/dL (ref 0–200)
HDL: 30.8 mg/dL — ABNORMAL LOW (ref >39.00)
LDL CALC: 117 mg/dL — AB (ref 0–99)
NonHDL: 133.56
TRIGLYCERIDES: 85 mg/dL (ref 0.0–149.0)
Total CHOL/HDL Ratio: 5
VLDL: 17 mg/dL (ref 0.0–40.0)

## 2016-01-30 LAB — CBC
HEMATOCRIT: 38 % (ref 36.0–46.0)
HEMOGLOBIN: 12.4 g/dL (ref 12.0–15.0)
MCHC: 32.5 g/dL (ref 30.0–36.0)
MCV: 83.8 fl (ref 78.0–100.0)
Platelets: 317 10*3/uL (ref 150.0–400.0)
RBC: 4.54 Mil/uL (ref 3.87–5.11)
RDW: 15.5 % (ref 11.5–15.5)
WBC: 5.2 10*3/uL (ref 4.0–10.5)

## 2016-01-30 MED ORDER — LISINOPRIL-HYDROCHLOROTHIAZIDE 20-25 MG PO TABS: 1.0000 | ORAL_TABLET | Freq: Every day | ORAL | 3 refills | Status: DC

## 2016-01-30 NOTE — Patient Instructions (Signed)
BEFORE YOU LEAVE: -labs -follow up: 1)Nurse visit for BP check in 1 month 2) follow up with Dr. Maudie Mercury in 3 months  Call the [facility] today to schedule your mammogram.  Please complete the cologuard test today.  We have ordered labs or studies at this visit. It can take up to 1-2 weeks for results and processing. IF results require follow up or explanation, we will call you with instructions. Clinically stable results will be released to your [facility]. If you have not heard from [place] or cannot find your results in [facility] in 2 weeks please contact our office at [phone].    We recommend the following healthy lifestyle for LIFE: 1) Small portions.   Tip: eat off of a salad plate instead of a dinner plate.  Tip: if you need more or a snack choose fruits, veggies and/or a handful of nuts or seeds.  2) Eat a healthy clean diet.  * Tip: Avoid (less then 1 serving per week): processed foods, sweets, sweetened drinks, white starches (rice, flour, bread, potatoes, pasta, etc), red meat, fast foods, butter  *Tip: CHOOSE instead   * 5-9 servings per day of fresh or frozen fruits and vegetables (but not corn, potatoes, bananas, canned or dried fruit)   *nuts and seeds, beans   *olives and olive oil   *small portions of lean meats such as fish and white chicken    *small portions of whole grains  3)Get at least 150 minutes of sweaty aerobic exercise per week.  4)Reduce stress - consider counseling, meditation and relaxation to balance other aspects of your life.

## 2016-01-30 NOTE — Progress Notes (Signed)
HPI:  Alexandra Flores is a pleasant 71 yo with a PMH significant for HTN, HLD, Tobacco use and overweight BMI here for follow up. She had some R ear pain, sinus congestion and a swollen superior ant cervical lymph node at her last exam. Reports Doing well. Reports ear, sinus and gland issues all resolved. No regular exercise and diet ok, but wants to do better. Continues to smoke. Denies CP, SOB, DOE, swelling. Due for mammogram, colon cancer screening, recheck lymph node and labs - lipids, bmp, cbc We ordered cologuard and advised her to schedule her mammogram several months ago. She reports has the cologuard kit and agrees to complete. Agrees to set up mammogram.  Chronic Conditions Summary:  1)HTN: -meds: norvasc 10, benazepril 20, hctz 25  2)HLD: -takes pravastatin 29m - poor compliance in the past  3)Tobacco Use: -counseled many time int the past -really enjoys, has increased to 1 ppd , > 21 py history - she understands risks but does not want to quit currently  4) Overweight: -wt increasing -admits to very poor diet - several sodas daily and lots of sugar in coffee -no exercise - used to walk and admits needs to get back to walking  ROS: See pertinent positives and negatives per HPI.  Past Medical History:  Diagnosis Date  . GERD (gastroesophageal reflux disease)   . Hypertension     Past Surgical History:  Procedure Laterality Date  . BREAST SURGERY     benign cyst removal    Family History  Problem Relation Age of Onset  . Stroke Mother   . Hypertension Mother   . Cancer Sister 537   'stomach cancer'     Social History   Social History  . Marital status: Married    Spouse name: N/A  . Number of children: N/A  . Years of education: N/A   Social History Main Topics  . Smoking status: Current Every Day Smoker    Packs/day: 0.80    Years: 48.00    Types: Cigarettes  . Smokeless tobacco: None     Comment: per patient not even a pack a day; takes 3  puffs and puts it out   . Alcohol use 0.0 oz/week     Comment: occ   . Drug use: Unknown  . Sexual activity: Not Asked   Other Topics Concern  . None   Social History Narrative   Work or School: none, retired, worked at [employer] 25 years       Home Situation: lives with husband; mother passed in 2017      Spiritual Beliefs: methodist, christian      Lifestyle: does bowling every Wednesday, no regular exercise otherwise, diet is not great - husband is active and exercises              Current Outpatient Prescriptions:  .  amLODipine (NORVASC) 10 MG tablet, TAKE 1 TABLET (10 MG TOTAL) BY MOUTH DAILY., Disp: 90 tablet, Rfl: 1 .  atorvastatin (LIPITOR) 20 MG tablet, Take 1 tablet (20 mg total) by mouth daily., Disp: 90 tablet, Rfl: 3 .  tiZANidine (ZANAFLEX) 2 MG tablet, Take 1 tablet (2 mg total) by mouth every 6 (six) hours as needed for muscle spasms., Disp: 20 tablet, Rfl: 0 .  triamcinolone ointment (KENALOG) 0.5 %, Apply 1 application topically 2 (two) times daily., Disp: 30 g, Rfl: 0 .  lisinopril-hydrochlorothiazide (PRINZIDE,ZESTORETIC) 20-25 MG tablet, Take 1 tablet by mouth daily., Disp: 90 tablet, Rfl:  3  EXAM:  Vitals:   01/30/16 0827  BP: 132/82  Pulse: 90  Temp: 97.9 F (36.6 C)    Body mass index is 27.82 kg/m.  GENERAL: vitals reviewed and listed above, alert, oriented, appears well hydrated and in no acute distress  HEENT: atraumatic, conjunttiva clear, no obvious abnormalities on inspection of external nose and ears  NECK: no obvious masses on inspection  LUNGS: clear to auscultation bilaterally, no wheezes, rales or rhonchi, good air movement  CV: HRRR, no peripheral edema  MS: moves all extremities without noticeable abnormality  PSYCH: pleasant and cooperative, no obvious depression or anxiety  ASSESSMENT AND PLAN:  Discussed the following assessment and plan:  Essential hypertension - Plan: Basic metabolic panel, CBC (no  diff)  Tobacco use  Hyperlipidemia, unspecified hyperlipidemia type - Plan: Lipid Panel  -lifestyle recommendations, smoking cessation advised -offered to set up mammogram for her, she declines and reports she will call to schedule -advised to complete cololguard, she agrees to do -labs today -Patient advised to return or notify a doctor immediately if symptoms worsen or persist or new concerns arise.  Patient Instructions  BEFORE YOU LEAVE: -labs -follow up: 1)Nurse visit for BP check in 1 month 2) follow up with Dr. Maudie Mercury in 3 months  Call the [facility] today to schedule your mammogram.  Please complete the cologuard test today.  We have ordered labs or studies at this visit. It can take up to 1-2 weeks for results and processing. IF results require follow up or explanation, we will call you with instructions. Clinically stable results will be released to your [facility]. If you have not heard from [place] or cannot find your results in [facility] in 2 weeks please contact our office at [phone].    We recommend the following healthy lifestyle for LIFE: 1) Small portions.   Tip: eat off of a salad plate instead of a dinner plate.  Tip: if you need more or a snack choose fruits, veggies and/or a handful of nuts or seeds.  2) Eat a healthy clean diet.  * Tip: Avoid (less then 1 serving per week): processed foods, sweets, sweetened drinks, white starches (rice, flour, bread, potatoes, pasta, etc), red meat, fast foods, butter  *Tip: CHOOSE instead   * 5-9 servings per day of fresh or frozen fruits and vegetables (but not corn, potatoes, bananas, canned or dried fruit)   *nuts and seeds, beans   *olives and olive oil   *small portions of lean meats such as fish and white chicken    *small portions of whole grains  3)Get at least 150 minutes of sweaty aerobic exercise per week.  4)Reduce stress - consider counseling, meditation and relaxation to balance other aspects of your  life.            Colin Benton R., DO

## 2016-01-30 NOTE — Progress Notes (Signed)
Pre visit review using our clinic review tool, if applicable. No additional management support is needed unless otherwise documented below in the visit note. 

## 2016-05-02 NOTE — Progress Notes (Signed)
HPI:  Alexandra Flores is a pleasant 71 y.o. here for follow up. Chronic medical problems summarized below were reviewed for changes and stability and were updated as needed below. These issues and their treatment remain stable for the most part. She feels she is doing well and has no complaints. Continues to smoke 1ppd. Enjoys it and understands risks as we have discussed them today and many times, but she does not want to quit. No aerobic exercise. Feels diet is ok...she is trying to improve. Asks about diet pills. Denies CP, SOB, DOE, treatment intolerance or new symptoms.  HTN: -meds: norvasc 10, benazepril 20, hctz 25  HLD: -takes pravastatin 20mg  - poor compliance in the past  Tobacco Use: -counseled many times -really enjoys, has increased to 1 ppd , > 21 py history - she understands risks but does not want to quit currently  4) Overweight: -no exercise - used to walk and admits needs to get back to walking -hx very poor diet with lots of sugar, trying to improve  ROS: See pertinent positives and negatives per HPI.  Past Medical History:  Diagnosis Date  . GERD (gastroesophageal reflux disease)   . Hypertension     Past Surgical History:  Procedure Laterality Date  . BREAST SURGERY     benign cyst removal    Family History  Problem Relation Age of Onset  . Stroke Mother   . Hypertension Mother   . Cancer Sister 52    'stomach cancer'     Social History   Social History  . Marital status: Married    Spouse name: N/A  . Number of children: N/A  . Years of education: N/A   Social History Main Topics  . Smoking status: Current Every Day Smoker    Packs/day: 0.80    Years: 48.00    Types: Cigarettes  . Smokeless tobacco: Never Used     Comment: per patient not even a pack a day; takes 3 puffs and puts it out   . Alcohol use 0.0 oz/week     Comment: occ   . Drug use: Unknown  . Sexual activity: Not Asked   Other Topics Concern  . None   Social  History Narrative   Work or School: none, retired, worked at [employer] for 25 years       Home Situation: lives with husband; mother passed in 2017      Spiritual Beliefs: methodist, christian      Lifestyle: does bowling every Wednesday, no regular exercise otherwise, diet is not great - husband is active and exercises              Current Outpatient Prescriptions:  .  amLODipine (NORVASC) 10 MG tablet, TAKE 1 TABLET (10 MG TOTAL) BY MOUTH DAILY., Disp: 90 tablet, Rfl: 1 .  atorvastatin (LIPITOR) 20 MG tablet, Take 1 tablet (20 mg total) by mouth daily., Disp: 90 tablet, Rfl: 3 .  lisinopril-hydrochlorothiazide (PRINZIDE,ZESTORETIC) 20-25 MG tablet, Take 1 tablet by mouth daily., Disp: 90 tablet, Rfl: 3 .  tiZANidine (ZANAFLEX) 2 MG tablet, Take 1 tablet (2 mg total) by mouth every 6 (six) hours as needed for muscle spasms., Disp: 20 tablet, Rfl: 0 .  triamcinolone ointment (KENALOG) 0.5 %, Apply 1 application topically 2 (two) times daily., Disp: 30 g, Rfl: 0  EXAM:  Vitals:   05/03/16 0959  BP: 122/82  Pulse: 85  Temp: 98.4 F (36.9 C)    Body mass index is 27.58 kg/m.  GENERAL: vitals reviewed and listed above, alert, oriented, appears well hydrated and in no acute distress  HEENT: atraumatic, conjunttiva clear, no obvious abnormalities on inspection of external nose and ears  NECK: no obvious masses on inspection  LUNGS: clear to auscultation bilaterally, no wheezes, rales or rhonchi, good air movement  CV: HRRR, no peripheral edema  MS: moves all extremities without noticeable abnormality  PSYCH: pleasant and cooperative, no obvious depression or anxiety  ASSESSMENT AND PLAN:  Discussed the following assessment and plan:  Essential hypertension  Hyperlipidemia, unspecified hyperlipidemia type  Tobacco use  BMI 27.0-27.9,adult  -counseled to quit smoking - 3-5 minutes -lifestyle recs, discussed diet pills risks and benefits - in her case feel lifestyle  changes benefits > risks compared to meds, she agrees -labs at [facility] exam in sept -Patient advised to return or notify a doctor immediately if symptoms worsen or persist or new concerns arise.  Patient Instructions  BEFORE YOU LEAVE: -follow up: Medicare exam in September  Please quit smoking.   We recommend the following healthy lifestyle for LIFE: 1) Small portions.   Tip: eat off of a salad plate instead of a dinner plate.  Tip: if you need more or a snack choose fruits, veggies and/or a handful of nuts or seeds.  2) Eat a healthy clean diet.  * Tip: Avoid (less then 1 serving per week): processed foods, sweets, sweetened drinks, white starches (rice, flour, bread, potatoes, pasta, etc), red meat, fast foods, butter  *Tip: CHOOSE instead   * 5-9 servings per day of fresh or frozen fruits and vegetables (but not corn, potatoes, bananas, canned or dried fruit)   *nuts and seeds, beans   *olives and olive oil   *small portions of lean meats such as fish and white chicken    *small portions of whole grains  3)Get at least 150 minutes of sweaty aerobic exercise per week.  4)Reduce stress - consider counseling, meditation and relaxation to balance other aspects of your life.     Colin Benton R., DO

## 2016-05-03 ENCOUNTER — Encounter: Payer: Self-pay | Admitting: Family Medicine

## 2016-05-03 ENCOUNTER — Ambulatory Visit (INDEPENDENT_AMBULATORY_CARE_PROVIDER_SITE_OTHER): Payer: Medicare Other | Admitting: Family Medicine

## 2016-05-03 VITALS — BP 122/82 | HR 85 | Temp 98.4°F | Ht 66.25 in | Wt 172.2 lb

## 2016-05-03 DIAGNOSIS — E785 Hyperlipidemia, unspecified: Secondary | ICD-10-CM | POA: Diagnosis not present

## 2016-05-03 DIAGNOSIS — I10 Essential (primary) hypertension: Secondary | ICD-10-CM

## 2016-05-03 DIAGNOSIS — Z6827 Body mass index (BMI) 27.0-27.9, adult: Secondary | ICD-10-CM | POA: Diagnosis not present

## 2016-05-03 DIAGNOSIS — Z720 Tobacco use: Secondary | ICD-10-CM

## 2016-05-03 DIAGNOSIS — F17210 Nicotine dependence, cigarettes, uncomplicated: Secondary | ICD-10-CM

## 2016-05-03 NOTE — Progress Notes (Signed)
Pre visit review using our clinic review tool, if applicable. No additional management support is needed unless otherwise documented below in the visit note. 

## 2016-05-03 NOTE — Patient Instructions (Addendum)
BEFORE YOU LEAVE: -follow up: Medicare exam in September  Please quit smoking.   We recommend the following healthy lifestyle for LIFE: 1) Small portions.   Tip: eat off of a salad plate instead of a dinner plate.  Tip: if you need more or a snack choose fruits, veggies and/or a handful of nuts or seeds.  2) Eat a healthy clean diet.  * Tip: Avoid (less then 1 serving per week): processed foods, sweets, sweetened drinks, white starches (rice, flour, bread, potatoes, pasta, etc), red meat, fast foods, butter  *Tip: CHOOSE instead   * 5-9 servings per day of fresh or frozen fruits and vegetables (but not corn, potatoes, bananas, canned or dried fruit)   *nuts and seeds, beans   *olives and olive oil   *small portions of lean meats such as fish and white chicken    *small portions of whole grains  3)Get at least 150 minutes of sweaty aerobic exercise per week.  4)Reduce stress - consider counseling, meditation and relaxation to balance other aspects of your life.

## 2016-07-26 ENCOUNTER — Other Ambulatory Visit: Payer: Self-pay | Admitting: Family Medicine

## 2016-07-26 DIAGNOSIS — I10 Essential (primary) hypertension: Secondary | ICD-10-CM

## 2016-07-26 MED ORDER — AMLODIPINE BESYLATE 10 MG PO TABS: 10.0000 mg | ORAL_TABLET | Freq: Every day | ORAL | 0 refills | Status: DC

## 2016-07-26 MED ORDER — ATORVASTATIN CALCIUM 20 MG PO TABS: 20.0000 mg | ORAL_TABLET | Freq: Every day | ORAL | 0 refills | Status: DC

## 2016-07-26 NOTE — Telephone Encounter (Signed)
Pt request refill  amLODipine (NORVASC) 10 MG tablet  atorvastatin (LIPITOR) 20 MG tablet   [id number] - [place], [place] - [address]

## 2016-10-07 ENCOUNTER — Encounter: Payer: Self-pay | Admitting: Family Medicine

## 2016-10-09 ENCOUNTER — Encounter: Payer: Self-pay | Admitting: Family Medicine

## 2016-10-09 NOTE — Progress Notes (Deleted)
Medicare Annual Preventive Care Visit  (initial annual wellness or annual wellness exam)  Due for mammo, colon ca screening, labs. Declined fly and pneumonia vaccines in the past. Declined bone density screening in the past.  Concerns and/or follow up today:  Due for mammo, colon ca screening, AWV,   HTN: -meds: norvasc 10, benazepril 20, hctz 25  HLD: -takes pravastatin 20mg  - poor compliance in the past  Tobacco Use: -counseled many times -really enjoys, has increased to 1 ppd , > 21 py history - she understands risks but does not want to quit currently  Overweight: -no exercise - used to walk and admits needs to get back to walking -hx very poor diet with lots of sugar, trying to improve  See HM section in Epic for other details of completed HM. See scanned documentation under Media Tab for further documentation HPI, health risk assessment. See Media Tab and Care Teams sections in Epic for other providers.  ROS: negative for report of fevers, unintentional weight loss, vision changes, vision loss, hearing loss or change, chest pain, sob, hemoptysis, melena, hematochezia, hematuria, genital discharge or lesions, falls, bleeding or bruising, loc, thoughts of suicide or self harm, memory loss  1.) Patient-completed health risk assessment  - completed and reviewed, see scanned documentation  2.) Review of Medical History: -PMH, PSH, Family History and current specialty and care providers reviewed and updated and listed below  - see scanned in document in chart and below  Past Medical History:  Diagnosis Date  . GERD (gastroesophageal reflux disease)   . Hyperlipemia 09/30/2015  . Hypertension   . Tobacco use 02/10/2012    Past Surgical History:  Procedure Laterality Date  . BREAST SURGERY     benign cyst removal    Social History   Social History  . Marital status: Married    Spouse name: N/A  . Number of children: N/A  . Years of education: N/A    Occupational History  . Not on file.   Social History Main Topics  . Smoking status: Current Every Day Smoker    Packs/day: 0.80    Years: 48.00    Types: Cigarettes  . Smokeless tobacco: Never Used     Comment: per patient not even a pack a day; takes 3 puffs and puts it out   . Alcohol use 0.0 oz/week     Comment: occ   . Drug use: Unknown  . Sexual activity: Not on file   Other Topics Concern  . Not on file   Social History Narrative   Work or School: none, retired, worked at [employer] for 25 years       Home Situation: lives with husband; mother passed in 2017      Spiritual Beliefs: methodist, christian      Lifestyle: does bowling every Wednesday, no regular exercise otherwise, diet is not great - husband is active and exercises             Family History  Problem Relation Age of Onset  . Stroke Mother   . Hypertension Mother   . Cancer Sister 18       'stomach cancer'     Current Outpatient Prescriptions on File Prior to Visit  Medication Sig Dispense Refill  . amLODipine (NORVASC) 10 MG tablet Take 1 tablet (10 mg total) by mouth daily. 90 tablet 0  . atorvastatin (LIPITOR) 20 MG tablet Take 1 tablet (20 mg total) by mouth daily. 90 tablet 0  . lisinopril-hydrochlorothiazide (  PRINZIDE,ZESTORETIC) 20-25 MG tablet Take 1 tablet by mouth daily. 90 tablet 3  . tiZANidine (ZANAFLEX) 2 MG tablet Take 1 tablet (2 mg total) by mouth every 6 (six) hours as needed for muscle spasms. 20 tablet 0  . triamcinolone ointment (KENALOG) 0.5 % Apply 1 application topically 2 (two) times daily. 30 g 0   No current facility-administered medications on file prior to visit.      3.) Review of functional ability and level of safety:  Any difficulty hearing?  See scanned documentation  History of falling?  See scanned documentation  Any trouble with IADLs - using a phone, using transportation, grocery shopping, preparing meals, doing housework, doing laundry, taking  medications and managing money?  See scanned documentation  Advance Directives?  Discussed briefly and offered more resources and detailed discussion with our trained staff.   See summary of recommendations in Patient Instructions below.  4.) Physical Exam There were no vitals filed for this visit. Estimated body mass index is 27.58 kg/m as calculated from the following:   Height as of 05/03/16: 5' 6.25" (1.683 m).   Weight as of 05/03/16: 172 lb 3.2 oz (78.1 kg).  EKG (optional): deferred  General: alert, appear well hydrated and in no acute distress  HEENT: visual acuity grossly intact  CV: HRRR  Lungs: CTA bilaterally  Psych: pleasant and cooperative, no obvious depression or anxiety  Cognitive function grossly intact  See patient instructions for recommendations.  Education and counseling regarding the above review of health provided with a plan for the following: -see scanned patient completed form for further details -fall prevention strategies discussed  -healthy lifestyle discussed -importance and resources for completing advanced directives discussed -see patient instructions below for any other recommendations provided  4)The following written screening schedule of preventive measures were reviewed with assessment and plan made per below, orders and patient instructions:      AAA screening done if applicable     Alcohol screening done     Obesity Screening and counseling done     STI screening (Hep C if born [birth year]) offered and per pt wishes     Tobacco Screening done done       Pneumococcal (PPSV23 -one dose after 64, one before if risk factors), influenza yearly and hepatitis B vaccines (if high risk - end stage renal disease, IV drugs, homosexual men, live in home for mentally retarded, hemophilia receiving factors) ASSESSMENT/PLAN: done if applicable      Screening mammograph (yearly if >40) ASSESSMENT/PLAN: utd or ordered      Screening Pap  smear/pelvic exam (q2 years) ASSESSMENT/PLAN: n/a, declined      Prostate cancer screening ASSESSMENT/PLAN: n/a, declined      Colorectal cancer screening (FOBT yearly or flex sig q4y or colonoscopy q10y or barium enema q4y) ASSESSMENT/PLAN: utd or ordered      Diabetes outpatient self-management training services ASSESSMENT/PLAN: utd or done      Bone mass measurements(covered q2y if indicated - estrogen def, osteoporosis, hyperparathyroid, vertebral abnormalities, osteoporosis or steroids) ASSESSMENT/PLAN: utd or discussed and ordered per pt wishes      Screening for glaucoma(q1y if high risk - diabetes, FH, AA and > 50 or hispanic and > 65) ASSESSMENT/PLAN: utd or advised      Medical nutritional therapy for individuals with diabetes or renal disease ASSESSMENT/PLAN: see orders      Cardiovascular screening blood tests (lipids q5y) ASSESSMENT/PLAN: see orders and labs      Diabetes screening tests ASSESSMENT/PLAN: see  orders and labs   7.) Summary: -risk factors and conditions per above assessment were discussed and treatment, recommendations and referrals were offered per documentation above and orders and patient instructions.  No diagnosis found.  There are no Patient Instructions on file for this visit.  Colin Benton R., DO

## 2016-10-11 ENCOUNTER — Encounter: Payer: Medicare Other | Admitting: Family Medicine

## 2016-10-11 DIAGNOSIS — Z0289 Encounter for other administrative examinations: Secondary | ICD-10-CM

## 2016-10-22 ENCOUNTER — Other Ambulatory Visit: Payer: Self-pay | Admitting: Family Medicine

## 2016-10-22 DIAGNOSIS — I10 Essential (primary) hypertension: Secondary | ICD-10-CM

## 2016-11-21 ENCOUNTER — Other Ambulatory Visit: Payer: Self-pay | Admitting: Family Medicine

## 2016-11-21 DIAGNOSIS — I10 Essential (primary) hypertension: Secondary | ICD-10-CM

## 2017-01-27 ENCOUNTER — Encounter: Payer: Self-pay | Admitting: Family Medicine

## 2017-02-11 ENCOUNTER — Other Ambulatory Visit: Payer: Self-pay | Admitting: Family Medicine

## 2017-03-28 NOTE — Progress Notes (Signed)
Medicare Annual Preventive Care Visit  (initial annual wellness or annual wellness exam)  Using dictation device. Unfortunately this device frequently misinterprets words/phrases.  Concerns and/or follow up today:  Alexandra Flores is a pleasant 72 y.o. here for follow up. Chronic medical problems summarized below were reviewed for changes.  She reports she is doing well for the most part.  Her blood pressure is a bit up today and she does admit to missing her medication several times per week.  She simply forgets to take them.  She is not using a pillbox.  She did take her medications this morning she reports.  She does not get regular aerobic exercise, but does bowl 2 times per week.  She reports she cooks most of her meals at home and feels she eats a lot of vegetables and gets plenty of protein.  She continues to smoke.  She understands the risks which we reviewed again today.  She simply really enjoys smoking and does not wish to quit.  She agrees to let us know if she changes her mind.  She drinks a few drinks of alcohol rarely.  She has bad eczema which she has had as a child.  She would like a refill on triamcinolone that was provided at the urgent care.  She has been using petroleum jelly as an emollient.  Currently her skin is doing okay, but she does have a patch on her back.. Denies CP, SOB, DOE, treatment intolerance or new symptoms. Due for labs, mammogram, colon cancer screening, flu vaccine, pcv13 vaccine, dexa. She has a Cologuard test at home per her report and she agrees to complete it.  She refuses a bone density screening, lung cancer screening and all recommended vaccines She reports that she will schedule her mammogram.  Refused many preventive care measures in the past. Not seen in almost 1 year.  In the past reported seeing gynecologist for women's health exams.  HTN: -meds: norvasc 10, benazepril 20, hctz 25  HLD: -takes pravastatin 71m - poor compliance in the  past  Tobacco Use: -counseled many times -really enjoys, has increased to 1 ppd  - she understands risks but does not want to quit currently -Refuses lung cancer screening  Overweight: -Bowling twice per week -Trying to improve diet, does eat a lot of sugar   See HM section in Epic for other details of completed HM. See scanned documentation under Media Tab for further documentation HPI, health risk assessment. See Media Tab and Care Teams sections in Epic for other providers.  ROS: negative for report of fevers, unintentional weight loss, vision changes, vision loss, hearing loss or change, chest pain, sob, hemoptysis, melena, hematochezia, hematuria, genital discharge or lesions, falls, bleeding or bruising, loc, thoughts of suicide or self harm, memory loss  1.) Patient-completed health risk assessment  - completed and reviewed, see scanned documentation  2.) Review of Medical History: -PMH, PSH, Family History and current specialty and care providers reviewed and updated and listed below  - see scanned in document in chart and below  Past Medical History:  Diagnosis Date  . GERD (gastroesophageal reflux disease)   . Hyperlipemia 09/30/2015  . Hypertension   . Tobacco use 02/10/2012    Past Surgical History:  Procedure Laterality Date  . BREAST SURGERY     benign cyst removal    Social History   Socioeconomic History  . Marital status: Married    Spouse name: Not on file  . Number of children: Not  on file  . Years of education: Not on file  . Highest education level: Not on file  Social Needs  . Financial resource strain: Not on file  . Food insecurity - worry: Not on file  . Food insecurity - inability: Not on file  . Transportation needs - medical: Not on file  . Transportation needs - non-medical: Not on file  Occupational History  . Not on file  Tobacco Use  . Smoking status: Current Every Day Smoker    Packs/day: 0.80    Years: 48.00    Pack years:  38.40    Types: Cigarettes  . Smokeless tobacco: Never Used  . Tobacco comment: per patient not even a pack a day; takes 3 puffs and puts it out   Substance and Sexual Activity  . Alcohol use: Yes    Alcohol/week: 0.0 oz    Comment: occ   . Drug use: Not on file  . Sexual activity: Not on file  Other Topics Concern  . Not on file  Social History Narrative   Work or School: none, retired, worked at [employer] for 25 years       Home Situation: lives with husband; mother passed in 2017      Spiritual Beliefs: methodist, christian      Lifestyle: does bowling every Wednesday, no regular exercise otherwise, diet is not great - husband is active and exercises          Family History  Problem Relation Age of Onset  . Stroke Mother   . Hypertension Mother   . Cancer Sister 72       'stomach cancer'     No current outpatient medications on file prior to visit.   No current facility-administered medications on file prior to visit.      3.) Review of functional ability and level of safety:  Any difficulty hearing?  See scanned documentation  History of falling?  See scanned documentation  Any trouble with IADLs - using a phone, using transportation, grocery shopping, preparing meals, doing housework, doing laundry, taking medications and managing money?  See scanned documentation  Advance Directives?  Discussed briefly and offered more resources and detailed discussion with our trained staff.   See summary of recommendations in Patient Instructions below.  4.) Physical Exam Vitals:   03/29/17 1103  BP: (!) 142/88  Pulse: 87  Temp: 98 F (36.7 C)   Estimated body mass index is 23.66 kg/m as calculated from the following:   Height as of this encounter: '5\' 6"'  (1.676 m).   Weight as of this encounter: 146 lb 9.6 oz (66.5 kg).  EKG (optional): deferred  General: alert, appear well hydrated and in no acute distress  HEENT: visual acuity grossly intact  CV:  HRRR  Lungs: CTA bilaterally  Psych: pleasant and cooperative, no obvious depression or anxiety  Cognitive function grossly intact  See patient instructions for recommendations.  Education and counseling regarding the above review of health provided with a plan for the following: -see scanned patient completed form for further details -fall prevention strategies discussed  -healthy lifestyle discussed -importance and resources for completing advanced directives discussed -see patient instructions below for any other recommendations provided  4)The following written screening schedule of preventive measures were reviewed with assessment and plan made per below, orders and patient instructions:         Alcohol screening done     Obesity Screening and counseling done     STI screening (Hep  C if born [birth year]) offered and per pt wishes     Tobacco Screening done        Pneumococcal (PPSV23 -one dose after 62, one before if risk factors), influenza yearly and hepatitis B vaccines (if high risk - end stage renal disease, IV drugs, homosexual men, live in home for mentally retarded, hemophilia receiving factors) ASSESSMENT/PLAN: Declined      Screening mammograph (yearly if >40) ASSESSMENT/PLAN: She agrees to schedule herself      Screening Pap smear/pelvic exam (q2 years) ASSESSMENT/PLAN: n/a, declined      Colorectal cancer screening (FOBT yearly or flex sig q4y or colonoscopy q10y or barium enema q4y) ASSESSMENT/PLAN: utd or ordered      Diabetes outpatient self-management training services ASSESSMENT/PLAN: utd or done      Bone mass measurements(covered q2y if indicated - estrogen def, osteoporosis, hyperparathyroid, vertebral abnormalities, osteoporosis or steroids) ASSESSMENT/PLAN: utd or discussed and ordered per pt wishes      Screening for glaucoma(q1y if high risk - diabetes, FH, AA and > 50 or hispanic and > 65) ASSESSMENT/PLAN: She declined      Medical nutritional  therapy for individuals with diabetes or renal disease ASSESSMENT/PLAN: see orders      Cardiovascular screening blood tests (lipids q5y) ASSESSMENT/PLAN: see orders and labs      Diabetes screening tests ASSESSMENT/PLAN: see orders and labs   7.) Summary:   Medicare annual wellness visit, subsequent -risk factors and conditions per above assessment were discussed and treatment, recommendations and referrals were offered per documentation above and orders and patient instructions.  Screening for depression -Negative  Essential hypertension - Plan: Basic metabolic panel, CBC -Advised to take her medications every day -Increased dose of her lisinopril -Advised follow-up in 1 month  Tobacco use -Counseled at length at least 5-10 minutes, discussed risk extensively -discussed motivation, she does not wish to quit and wants to continue despite the risk -Offered help, ask her to please let us know if she ever changes her mind as we would like to help her quit smoking -Advised lung cancer screening, she refused  Hyperlipidemia, unspecified hyperlipidemia type - Plan: Lipid panel -Labs, lifestyle recommendations  BMI 27.0-27.9,adult -Lifestyle recommendations  Patient Instructions   BEFORE YOU LEAVE: -labs -follow up: 1 month to recheck blood pressure   Alexandra Flores , Thank you for taking time to come for your Medicare Wellness Visit. I appreciate your ongoing commitment to your health goals. Please review the following plan we discussed and let me know if I can assist you in the future.   These are the goals we discussed: Goals      Lifestyle    . Please complete the cologard test for colon cancer screening  Please get your mammogram  Vit d3 [phone] IU daily  Please let us know if we can help you Quit smoking / using tobacco  Get regular exercise  Let us know if you change your mind about lung cancer screening       This is a list of the screening recommended  for you and due dates:  Health Maintenance  Topic Date Due  . Mammogram  Schedule yearly  . Cologard Due  . Flu Shot  Declined  . DEXA scan (bone density measurement)  Declined  . Pneumonia vaccines (1 of 2 - PCV13) Declined  . Tetanus Vaccine  02/23/2022  . Colon Cancer Screening  Complete the cologard  .  Hepatitis C: One time screening is recommended by Center for  Disease Control  (CDC) for  adults born from [birth year] through 1963.   Completed  *Topic was postponed. The date shown is not the original due date.   Health Maintenance, Female Adopting a healthy lifestyle and getting preventive care can go a long way to promote health and wellness. Talk with your health care provider about what schedule of regular examinations is right for you. This is a good chance for you to check in with your provider about disease prevention and staying healthy. In between checkups, there are plenty of things you can do on your own. Experts have done a lot of research about which lifestyle changes and preventive measures are most likely to keep you healthy. Ask your health care provider for more information. Weight and diet Eat a healthy diet  Be sure to include plenty of vegetables, fruits, low-fat dairy products, and lean protein.  Do not eat a lot of foods high in solid fats, added sugars, or salt.  Get regular exercise. This is one of the most important things you can do for your health. ? Most adults should exercise for at least 150 minutes each week. The exercise should increase your heart rate and make you sweat (moderate-intensity exercise). ? Most adults should also do strengthening exercises at least twice a week. This is in addition to the moderate-intensity exercise.  Maintain a healthy weight  Body mass index (BMI) is a measurement that can be used to identify possible weight problems. It estimates body fat based on height and weight. Your health care provider can help determine your BMI and  help you achieve or maintain a healthy weight.  For females 12 years of age and older: ? A BMI below 18.5 is considered underweight. ? A BMI of 18.5 to 24.9 is normal. ? A BMI of 25 to 29.9 is considered overweight. ? A BMI of 30 and above is considered obese.  Watch levels of cholesterol and blood lipids  You should start having your blood tested for lipids and cholesterol at 70 years of age, then have this test every 5 years.  You may need to have your cholesterol levels checked more often if: ? Your lipid or cholesterol levels are high. ? You are older than 70 years of age. ? You are at high risk for heart disease.  Cancer screening Lung Cancer  Lung cancer screening is recommended for adults 75-8 years old who are at high risk for lung cancer because of a history of smoking.  A yearly low-dose CT scan of the lungs is recommended for people who: ? Currently smoke. ? Have quit within the past 15 years. ? Have at least a 30-pack-year history of smoking. A pack year is smoking an average of one pack of cigarettes a day for 1 year.  Yearly screening should continue until it has been 15 years since you quit.  Yearly screening should stop if you develop a health problem that would prevent you from having lung cancer treatment.  Breast Cancer  Practice breast self-awareness. This means understanding how your breasts normally appear and feel.  It also means doing regular breast self-exams. Let your health care provider know about any changes, no matter how small.  If you are in your 20s or 30s, you should have a clinical breast exam (CBE) by a health care provider every 1-3 years as part of a regular health exam.  If you are 81 or older, have a CBE every year. Also consider having a breast  X-ray (mammogram) every year.  If you have a family history of breast cancer, talk to your health care provider about genetic screening.  If you are at high risk for breast cancer, talk to  your health care provider about having an MRI and a mammogram every year.  Breast cancer gene (BRCA) assessment is recommended for women who have family members with BRCA-related cancers. BRCA-related cancers include: ? Breast. ? Ovarian. ? Tubal. ? Peritoneal cancers.  Results of the assessment will determine the need for genetic counseling and BRCA1 and BRCA2 testing.  Cervical Cancer Your health care provider may recommend that you be screened regularly for cancer of the pelvic organs (ovaries, uterus, and vagina). This screening involves a pelvic examination, including checking for microscopic changes to the surface of your cervix (Pap test). You may be encouraged to have this screening done every 3 years, beginning at age 16.  For women ages 58-65, health care providers may recommend pelvic exams and Pap testing every 3 years, or they may recommend the Pap and pelvic exam, combined with testing for human papilloma virus (HPV), every 5 years. Some types of HPV increase your risk of cervical cancer. Testing for HPV may also be done on women of any age with unclear Pap test results.  Other health care providers may not recommend any screening for nonpregnant women who are considered low risk for pelvic cancer and who do not have symptoms. Ask your health care provider if a screening pelvic exam is right for you.  If you have had past treatment for cervical cancer or a condition that could lead to cancer, you need Pap tests and screening for cancer for at least 20 years after your treatment. If Pap tests have been discontinued, your risk factors (such as having a new sexual partner) need to be reassessed to determine if screening should resume. Some women have medical problems that increase the chance of getting cervical cancer. In these cases, your health care provider may recommend more frequent screening and Pap tests.  Colorectal Cancer  This type of cancer can be detected and often  prevented.  Routine colorectal cancer screening usually begins at 70 years of age and continues through 70 years of age.  Your health care provider may recommend screening at an earlier age if you have risk factors for colon cancer.  Your health care provider may also recommend using home test kits to check for hidden blood in the stool.  A small camera at the end of a tube can be used to examine your colon directly (sigmoidoscopy or colonoscopy). This is done to check for the earliest forms of colorectal cancer.  Routine screening usually begins at age 29.  Direct examination of the colon should be repeated every 5-10 years through 70 years of age. However, you may need to be screened more often if early forms of precancerous polyps or small growths are found.  Skin Cancer  Check your skin from head to toe regularly.  Tell your health care provider about any new moles or changes in moles, especially if there is a change in a mole's shape or color.  Also tell your health care provider if you have a mole that is larger than the size of a pencil eraser.  Always use sunscreen. Apply sunscreen liberally and repeatedly throughout the day.  Protect yourself by wearing long sleeves, pants, a wide-brimmed hat, and sunglasses whenever you are outside.  Heart disease, diabetes, and high blood pressure  High blood  pressure causes heart disease and increases the risk of stroke. High blood pressure is more likely to develop in: ? People who have blood pressure in the high end of the normal range (130-139/85-89 mm Hg). ? People who are overweight or obese. ? People who are African American.  If you are 74-13 years of age, have your blood pressure checked every 3-5 years. If you are 43 years of age or older, have your blood pressure checked every year. You should have your blood pressure measured twice-once when you are at a hospital or clinic, and once when you are not at a hospital or clinic.  Record the average of the two measurements. To check your blood pressure when you are not at a hospital or clinic, you can use: ? An automated blood pressure machine at a pharmacy. ? A home blood pressure monitor.  If you are between 75 years and 34 years old, ask your health care provider if you should take aspirin to prevent strokes.  Have regular diabetes screenings. This involves taking a blood sample to check your fasting blood sugar level. ? If you are at a normal weight and have a low risk for diabetes, have this test once every three years after 70 years of age. ? If you are overweight and have a high risk for diabetes, consider being tested at a younger age or more often. Preventing infection Hepatitis B  If you have a higher risk for hepatitis B, you should be screened for this virus. You are considered at high risk for hepatitis B if: ? You were born in a country where hepatitis B is common. Ask your health care provider which countries are considered high risk. ? Your parents were born in a high-risk country, and you have not been immunized against hepatitis B (hepatitis B vaccine). ? You have HIV or AIDS. ? You use needles to inject street drugs. ? You live with someone who has hepatitis B. ? You have had sex with someone who has hepatitis B. ? You get hemodialysis treatment. ? You take certain medicines for conditions, including cancer, organ transplantation, and autoimmune conditions.  Hepatitis C  Blood testing is recommended for: ? Everyone born from [birth year] through 1963. ? Anyone with known risk factors for hepatitis C.  Sexually transmitted infections (STIs)  You should be screened for sexually transmitted infections (STIs) including gonorrhea and chlamydia if: ? You are sexually active and are younger than 70 years of age. ? You are older than 70 years of age and your health care provider tells you that you are at risk for this type of infection. ? Your sexual  activity has changed since you were last screened and you are at an increased risk for chlamydia or gonorrhea. Ask your health care provider if you are at risk.  If you do not have HIV, but are at risk, it may be recommended that you take a prescription medicine daily to prevent HIV infection. This is called pre-exposure prophylaxis (PrEP). You are considered at risk if: ? You are sexually active and do not regularly use condoms or know the HIV status of your partner(s). ? You take drugs by injection. ? You are sexually active with a partner who has HIV.  Talk with your health care provider about whether you are at high risk of being infected with HIV. If you choose to begin PrEP, you should first be tested for HIV. You should then be tested every 3 months for as  long as you are taking PrEP. Pregnancy  If you are premenopausal and you may become pregnant, ask your health care provider about preconception counseling.  If you may become pregnant, take 400 to 800 micrograms (mcg) of folic acid every day.  If you want to prevent pregnancy, talk to your health care provider about birth control (contraception). Osteoporosis and menopause  Osteoporosis is a disease in which the bones lose minerals and strength with aging. This can result in serious bone fractures. Your risk for osteoporosis can be identified using a bone density scan.  If you are 10 years of age or older, or if you are at risk for osteoporosis and fractures, ask your health care provider if you should be screened.  Ask your health care provider whether you should take a calcium or vitamin D supplement to lower your risk for osteoporosis.  Menopause may have certain physical symptoms and risks.  Hormone replacement therapy may reduce some of these symptoms and risks. Talk to your health care provider about whether hormone replacement therapy is right for you. Follow these instructions at home:  Schedule regular health, dental,  and eye exams.  Stay current with your immunizations.  Do not use any tobacco products including cigarettes, chewing tobacco, or electronic cigarettes.  If you are pregnant, do not drink alcohol.  If you are breastfeeding, limit how much and how often you drink alcohol.  Limit alcohol intake to no more than 1 drink per day for nonpregnant women. One drink equals 12 ounces of beer, 5 ounces of wine, or 1 ounces of hard liquor.  Do not use street drugs.  Do not share needles.  Ask your health care provider for help if you need support or information about quitting drugs.  Tell your health care provider if you often feel depressed.  Tell your health care provider if you have ever been abused or do not feel safe at home. This information is not intended to replace advice given to you by your health care provider. Make sure you discuss any questions you have with your health care provider. Document Released: 07/20/2010 Document Revised: 06/12/2015 Document Reviewed: 10/08/2014 [employer] Interactive Patient Education  2018 [place], DO

## 2017-03-29 ENCOUNTER — Encounter: Payer: Self-pay | Admitting: Family Medicine

## 2017-03-29 ENCOUNTER — Ambulatory Visit (INDEPENDENT_AMBULATORY_CARE_PROVIDER_SITE_OTHER): Payer: Medicare Other | Admitting: Family Medicine

## 2017-03-29 VITALS — BP 142/88 | HR 87 | Temp 98.0°F | Ht 66.0 in | Wt 146.6 lb

## 2017-03-29 DIAGNOSIS — Z6827 Body mass index (BMI) 27.0-27.9, adult: Secondary | ICD-10-CM | POA: Diagnosis not present

## 2017-03-29 DIAGNOSIS — Z1331 Encounter for screening for depression: Secondary | ICD-10-CM | POA: Diagnosis not present

## 2017-03-29 DIAGNOSIS — Z0000 Encounter for general adult medical examination without abnormal findings: Secondary | ICD-10-CM | POA: Diagnosis not present

## 2017-03-29 DIAGNOSIS — Z720 Tobacco use: Secondary | ICD-10-CM | POA: Diagnosis not present

## 2017-03-29 DIAGNOSIS — I10 Essential (primary) hypertension: Secondary | ICD-10-CM

## 2017-03-29 DIAGNOSIS — E785 Hyperlipidemia, unspecified: Secondary | ICD-10-CM

## 2017-03-29 LAB — CBC
HCT: 37.8 % (ref 36.0–46.0)
HEMOGLOBIN: 12.6 g/dL (ref 12.0–15.0)
MCHC: 33.3 g/dL (ref 30.0–36.0)
MCV: 81.3 fl (ref 78.0–100.0)
Platelets: 303 10*3/uL (ref 150.0–400.0)
RBC: 4.65 Mil/uL (ref 3.87–5.11)
RDW: 15.3 % (ref 11.5–15.5)
WBC: 4.7 10*3/uL (ref 4.0–10.5)

## 2017-03-29 LAB — LIPID PANEL
Cholesterol: 138 mg/dL (ref 0–200)
HDL: 30.1 mg/dL — ABNORMAL LOW (ref >39.00)
LDL CALC: 91 mg/dL (ref 0–99)
NONHDL: 108.27
Total CHOL/HDL Ratio: 5
Triglycerides: 84 mg/dL (ref 0.0–149.0)
VLDL: 16.8 mg/dL (ref 0.0–40.0)

## 2017-03-29 LAB — BASIC METABOLIC PANEL
BUN: 9 mg/dL (ref 6–23)
CO2: 30 meq/L (ref 19–32)
Calcium: 10 mg/dL (ref 8.4–10.5)
Chloride: 102 mEq/L (ref 96–112)
Creatinine, Ser: 0.6 mg/dL (ref 0.40–1.20)
GFR: 127.33 mL/min (ref >60.00)
Glucose, Bld: 94 mg/dL (ref 70–99)
POTASSIUM: 3.9 meq/L (ref 3.5–5.1)
Sodium: 138 mEq/L (ref 135–145)

## 2017-03-29 MED ORDER — ATORVASTATIN CALCIUM 20 MG PO TABS: 20.0000 mg | ORAL_TABLET | Freq: Every day | ORAL | 3 refills | Status: DC

## 2017-03-29 MED ORDER — HYDROCHLOROTHIAZIDE 25 MG PO TABS: 25.0000 mg | ORAL_TABLET | Freq: Every day | ORAL | 3 refills | Status: DC

## 2017-03-29 MED ORDER — AMLODIPINE BESYLATE 10 MG PO TABS: 10.0000 mg | ORAL_TABLET | Freq: Every day | ORAL | 3 refills | Status: DC

## 2017-03-29 MED ORDER — TRIAMCINOLONE ACETONIDE 0.1 % EX CREA: 1.0000 "application " | TOPICAL_CREAM | Freq: Two times a day (BID) | CUTANEOUS | 3 refills | Status: DC

## 2017-03-29 MED ORDER — LISINOPRIL 40 MG PO TABS: 40.0000 mg | ORAL_TABLET | Freq: Every day | ORAL | 3 refills | Status: DC

## 2017-03-29 NOTE — Patient Instructions (Addendum)
BEFORE YOU LEAVE: -labs -follow up: 1 month to recheck blood pressure   Ms. Rawl , Thank you for taking time to come for your Medicare Wellness Visit. I appreciate your ongoing commitment to your health goals. Please review the following plan we discussed and let me know if I can assist you in the future.   These are the goals we discussed: Goals      Lifestyle    . Please complete the cologard test for colon cancer screening  Please get your mammogram  Vit d3 [phone] IU daily  Please let us know if we can help you Quit smoking / using tobacco  Get regular exercise  Let us know if you change your mind about lung cancer screening       This is a list of the screening recommended for you and due dates:  Health Maintenance  Topic Date Due  . Mammogram  Schedule yearly  . Cologard Due  . Flu Shot  Declined  . DEXA scan (bone density measurement)  Declined  . Pneumonia vaccines (1 of 2 - PCV13) Declined  . Tetanus Vaccine  02/23/2022  . Colon Cancer Screening  Complete the cologard  .  Hepatitis C: One time screening is recommended by Center for Disease Control  (CDC) for  adults born from [birth year] through 1963.   Completed  *Topic was postponed. The date shown is not the original due date.   Health Maintenance, Female Adopting a healthy lifestyle and getting preventive care can go a long way to promote health and wellness. Talk with your health care provider about what schedule of regular examinations is right for you. This is a good chance for you to check in with your provider about disease prevention and staying healthy. In between checkups, there are plenty of things you can do on your own. Experts have done a lot of research about which lifestyle changes and preventive measures are most likely to keep you healthy. Ask your health care provider for more information. Weight and diet Eat a healthy diet  Be sure to include plenty of vegetables, fruits, low-fat dairy products,  and lean protein.  Do not eat a lot of foods high in solid fats, added sugars, or salt.  Get regular exercise. This is one of the most important things you can do for your health. ? Most adults should exercise for at least 150 minutes each week. The exercise should increase your heart rate and make you sweat (moderate-intensity exercise). ? Most adults should also do strengthening exercises at least twice a week. This is in addition to the moderate-intensity exercise.  Maintain a healthy weight  Body mass index (BMI) is a measurement that can be used to identify possible weight problems. It estimates body fat based on height and weight. Your health care provider can help determine your BMI and help you achieve or maintain a healthy weight.  For females 10 years of age and older: ? A BMI below 18.5 is considered underweight. ? A BMI of 18.5 to 24.9 is normal. ? A BMI of 25 to 29.9 is considered overweight. ? A BMI of 30 and above is considered obese.  Watch levels of cholesterol and blood lipids  You should start having your blood tested for lipids and cholesterol at 70 years of age, then have this test every 5 years.  You may need to have your cholesterol levels checked more often if: ? Your lipid or cholesterol levels are high. ? You are  older than 70 years of age. ? You are at high risk for heart disease.  Cancer screening Lung Cancer  Lung cancer screening is recommended for adults 30-77 years old who are at high risk for lung cancer because of a history of smoking.  A yearly low-dose CT scan of the lungs is recommended for people who: ? Currently smoke. ? Have quit within the past 15 years. ? Have at least a 30-pack-year history of smoking. A pack year is smoking an average of one pack of cigarettes a day for 1 year.  Yearly screening should continue until it has been 15 years since you quit.  Yearly screening should stop if you develop a health problem that would prevent  you from having lung cancer treatment.  Breast Cancer  Practice breast self-awareness. This means understanding how your breasts normally appear and feel.  It also means doing regular breast self-exams. Let your health care provider know about any changes, no matter how small.  If you are in your 20s or 30s, you should have a clinical breast exam (CBE) by a health care provider every 1-3 years as part of a regular health exam.  If you are 38 or older, have a CBE every year. Also consider having a breast X-ray (mammogram) every year.  If you have a family history of breast cancer, talk to your health care provider about genetic screening.  If you are at high risk for breast cancer, talk to your health care provider about having an MRI and a mammogram every year.  Breast cancer gene (BRCA) assessment is recommended for women who have family members with BRCA-related cancers. BRCA-related cancers include: ? Breast. ? Ovarian. ? Tubal. ? Peritoneal cancers.  Results of the assessment will determine the need for genetic counseling and BRCA1 and BRCA2 testing.  Cervical Cancer Your health care provider may recommend that you be screened regularly for cancer of the pelvic organs (ovaries, uterus, and vagina). This screening involves a pelvic examination, including checking for microscopic changes to the surface of your cervix (Pap test). You may be encouraged to have this screening done every 3 years, beginning at age 69.  For women ages 105-65, health care providers may recommend pelvic exams and Pap testing every 3 years, or they may recommend the Pap and pelvic exam, combined with testing for human papilloma virus (HPV), every 5 years. Some types of HPV increase your risk of cervical cancer. Testing for HPV may also be done on women of any age with unclear Pap test results.  Other health care providers may not recommend any screening for nonpregnant women who are considered low risk for  pelvic cancer and who do not have symptoms. Ask your health care provider if a screening pelvic exam is right for you.  If you have had past treatment for cervical cancer or a condition that could lead to cancer, you need Pap tests and screening for cancer for at least 20 years after your treatment. If Pap tests have been discontinued, your risk factors (such as having a new sexual partner) need to be reassessed to determine if screening should resume. Some women have medical problems that increase the chance of getting cervical cancer. In these cases, your health care provider may recommend more frequent screening and Pap tests.  Colorectal Cancer  This type of cancer can be detected and often prevented.  Routine colorectal cancer screening usually begins at 70 years of age and continues through 70 years of age.  Your health care provider may recommend screening at an earlier age if you have risk factors for colon cancer.  Your health care provider may also recommend using home test kits to check for hidden blood in the stool.  A small camera at the end of a tube can be used to examine your colon directly (sigmoidoscopy or colonoscopy). This is done to check for the earliest forms of colorectal cancer.  Routine screening usually begins at age 1.  Direct examination of the colon should be repeated every 5-10 years through 70 years of age. However, you may need to be screened more often if early forms of precancerous polyps or small growths are found.  Skin Cancer  Check your skin from head to toe regularly.  Tell your health care provider about any new moles or changes in moles, especially if there is a change in a mole's shape or color.  Also tell your health care provider if you have a mole that is larger than the size of a pencil eraser.  Always use sunscreen. Apply sunscreen liberally and repeatedly throughout the day.  Protect yourself by wearing long sleeves, pants, a  wide-brimmed hat, and sunglasses whenever you are outside.  Heart disease, diabetes, and high blood pressure  High blood pressure causes heart disease and increases the risk of stroke. High blood pressure is more likely to develop in: ? People who have blood pressure in the high end of the normal range (130-139/85-89 mm Hg). ? People who are overweight or obese. ? People who are African American.  If you are 59-65 years of age, have your blood pressure checked every 3-5 years. If you are 29 years of age or older, have your blood pressure checked every year. You should have your blood pressure measured twice-once when you are at a hospital or clinic, and once when you are not at a hospital or clinic. Record the average of the two measurements. To check your blood pressure when you are not at a hospital or clinic, you can use: ? An automated blood pressure machine at a pharmacy. ? A home blood pressure monitor.  If you are between 31 years and 12 years old, ask your health care provider if you should take aspirin to prevent strokes.  Have regular diabetes screenings. This involves taking a blood sample to check your fasting blood sugar level. ? If you are at a normal weight and have a low risk for diabetes, have this test once every three years after 70 years of age. ? If you are overweight and have a high risk for diabetes, consider being tested at a younger age or more often. Preventing infection Hepatitis B  If you have a higher risk for hepatitis B, you should be screened for this virus. You are considered at high risk for hepatitis B if: ? You were born in a country where hepatitis B is common. Ask your health care provider which countries are considered high risk. ? Your parents were born in a high-risk country, and you have not been immunized against hepatitis B (hepatitis B vaccine). ? You have HIV or AIDS. ? You use needles to inject street drugs. ? You live with someone who has  hepatitis B. ? You have had sex with someone who has hepatitis B. ? You get hemodialysis treatment. ? You take certain medicines for conditions, including cancer, organ transplantation, and autoimmune conditions.  Hepatitis C  Blood testing is recommended for: ? Everyone born from [birth year] through  1965. ? Anyone with known risk factors for hepatitis C.  Sexually transmitted infections (STIs)  You should be screened for sexually transmitted infections (STIs) including gonorrhea and chlamydia if: ? You are sexually active and are younger than 70 years of age. ? You are older than 70 years of age and your health care provider tells you that you are at risk for this type of infection. ? Your sexual activity has changed since you were last screened and you are at an increased risk for chlamydia or gonorrhea. Ask your health care provider if you are at risk.  If you do not have HIV, but are at risk, it may be recommended that you take a prescription medicine daily to prevent HIV infection. This is called pre-exposure prophylaxis (PrEP). You are considered at risk if: ? You are sexually active and do not regularly use condoms or know the HIV status of your partner(s). ? You take drugs by injection. ? You are sexually active with a partner who has HIV.  Talk with your health care provider about whether you are at high risk of being infected with HIV. If you choose to begin PrEP, you should first be tested for HIV. You should then be tested every 3 months for as long as you are taking PrEP. Pregnancy  If you are premenopausal and you may become pregnant, ask your health care provider about preconception counseling.  If you may become pregnant, take 400 to 800 micrograms (mcg) of folic acid every day.  If you want to prevent pregnancy, talk to your health care provider about birth control (contraception). Osteoporosis and menopause  Osteoporosis is a disease in which the bones lose minerals and  strength with aging. This can result in serious bone fractures. Your risk for osteoporosis can be identified using a bone density scan.  If you are 9 years of age or older, or if you are at risk for osteoporosis and fractures, ask your health care provider if you should be screened.  Ask your health care provider whether you should take a calcium or vitamin D supplement to lower your risk for osteoporosis.  Menopause may have certain physical symptoms and risks.  Hormone replacement therapy may reduce some of these symptoms and risks. Talk to your health care provider about whether hormone replacement therapy is right for you. Follow these instructions at home:  Schedule regular health, dental, and eye exams.  Stay current with your immunizations.  Do not use any tobacco products including cigarettes, chewing tobacco, or electronic cigarettes.  If you are pregnant, do not drink alcohol.  If you are breastfeeding, limit how much and how often you drink alcohol.  Limit alcohol intake to no more than 1 drink per day for nonpregnant women. One drink equals 12 ounces of beer, 5 ounces of wine, or 1 ounces of hard liquor.  Do not use street drugs.  Do not share needles.  Ask your health care provider for help if you need support or information about quitting drugs.  Tell your health care provider if you often feel depressed.  Tell your health care provider if you have ever been abused or do not feel safe at home. This information is not intended to replace advice given to you by your health care provider. Make sure you discuss any questions you have with your health care provider. Document Released: 07/20/2010 Document Revised: 06/12/2015 Document Reviewed: 10/08/2014 Elsevier Interactive Patient Education  [employer].

## 2017-04-30 NOTE — Progress Notes (Signed)
HPI:  Using dictation device. Unfortunately this device frequently misinterprets words/phrases.  Follow up HTN: -increased lisinopril last month and advised improved compliance, lifestyle recs -smokes, does not want to quit -reports doing great, tolerating new dose well -denies CP, SOB, DOE, swelling   ROS: See pertinent positives and negatives per HPI.  Past Medical History:  Diagnosis Date  . GERD (gastroesophageal reflux disease)   . Hyperlipemia 09/30/2015  . Hypertension   . Tobacco use 02/10/2012    Past Surgical History:  Procedure Laterality Date  . BREAST SURGERY     benign cyst removal    Family History  Problem Relation Age of Onset  . Stroke Mother   . Hypertension Mother   . Cancer Sister 64       'stomach cancer'     SOCIAL HX: continues to smoke, not interested in quitting   Current Outpatient Medications:  .  amLODipine (NORVASC) 10 MG tablet, Take 1 tablet (10 mg total) by mouth daily., Disp: 90 tablet, Rfl: 3 .  atorvastatin (LIPITOR) 20 MG tablet, Take 1 tablet (20 mg total) by mouth daily., Disp: 90 tablet, Rfl: 3 .  hydrochlorothiazide (HYDRODIURIL) 25 MG tablet, Take 1 tablet (25 mg total) by mouth daily., Disp: 90 tablet, Rfl: 3 .  lisinopril (PRINIVIL,ZESTRIL) 40 MG tablet, Take 1 tablet (40 mg total) by mouth daily., Disp: 90 tablet, Rfl: 3 .  triamcinolone cream (KENALOG) 0.1 %, Apply 1 application topically 2 (two) times daily., Disp: 45 g, Rfl: 3  EXAM:  Vitals:   05/02/17 1140  BP: 122/70  Pulse: 82  Temp: 98.3 F (36.8 C)    Body mass index is 26.92 kg/m.  GENERAL: vitals reviewed and listed above, alert, oriented, appears well hydrated and in no acute distress  HEENT: atraumatic, conjunttiva clear, no obvious abnormalities on inspection of external nose and ears  NECK: no obvious masses on inspection  LUNGS: clear to auscultation bilaterally, no wheezes, rales or rhonchi, good air movement  CV: HRRR, no peripheral  edema  MS: moves all extremities without noticeable abnormality  PSYCH: pleasant and cooperative, no obvious depression or anxiety  ASSESSMENT AND PLAN:  Discussed the following assessment and plan:  Essential hypertension - Plan: Basic metabolic panel  -BP after sitting 5 minutes looks good, much improved -continue new dose, current regimen -BMP -lifestyle recs -advised to quit smoking -advised to complete health maintenance - reviewed -she declines vaccines  -Patient advised to return or notify a doctor immediately if symptoms worsen or persist or new concerns arise.  Patient Instructions  BEFORE YOU LEAVE: -lab -follow OV:FIEPP 3 months  Complete your mammogram and cologuard test.    We recommend the following healthy lifestyle for LIFE: 1) Small portions. But, make sure to get regular (at least 3 per day), healthy meals and small healthy snacks if needed.  2) Eat a healthy clean diet.   TRY TO EAT: -at least 5-7 servings of low sugar, colorful, and nutrient rich vegetables per day (not corn, potatoes or bananas.) -berries are the best choice if you wish to eat fruit (only eat small amounts if trying to reduce weight)  -lean meets (fish, white meat of chicken or [place]) -vegan proteins for some meals - beans or tofu, whole grains, nuts and seeds -Replace bad fats with good fats - good fats include: fish, nuts and seeds, canola oil, olive oil -small amounts of low fat or non fat dairy -small amounts of100 % whole grains - check the lables -drink  plenty of water  AVOID: -SUGAR, sweets, anything with added sugar, corn syrup or sweeteners - must read labels as even foods advertised as "healthy" often are loaded with sugar -if you must have a sweetener, small amounts of stevia may be best -sweetened beverages and artificially sweetened beverages -simple starches (rice, bread, potatoes, pasta, chips, etc - small amounts of 100% whole grains are ok) -red meat, pork,  butter -fried foods, fast food, processed food, excessive dairy, eggs and coconut.  3)Get at least 150 minutes of sweaty aerobic exercise per week.  4)Reduce stress - consider counseling, meditation and relaxation to balance other aspects of your life.     [name], DO

## 2017-05-02 ENCOUNTER — Ambulatory Visit (INDEPENDENT_AMBULATORY_CARE_PROVIDER_SITE_OTHER): Payer: Medicare Other | Admitting: Family Medicine

## 2017-05-02 ENCOUNTER — Encounter: Payer: Self-pay | Admitting: Family Medicine

## 2017-05-02 VITALS — BP 122/70 | HR 82 | Temp 98.3°F | Ht 66.0 in | Wt 166.8 lb

## 2017-05-02 DIAGNOSIS — I10 Essential (primary) hypertension: Secondary | ICD-10-CM | POA: Diagnosis not present

## 2017-05-02 LAB — BASIC METABOLIC PANEL
BUN: 7 mg/dL (ref 6–23)
CHLORIDE: 102 meq/L (ref 96–112)
CO2: 29 mEq/L (ref 19–32)
CREATININE: 0.62 mg/dL (ref 0.40–1.20)
Calcium: 9.6 mg/dL (ref 8.4–10.5)
GFR: 122.57 mL/min (ref >60.00)
Glucose, Bld: 110 mg/dL — ABNORMAL HIGH (ref 70–99)
Potassium: 3.6 mEq/L (ref 3.5–5.1)
Sodium: 138 mEq/L (ref 135–145)

## 2017-05-02 NOTE — Patient Instructions (Signed)
BEFORE YOU LEAVE: -lab -follow EQ:ASTMH 3 months  Complete your mammogram and cologuard test.    We recommend the following healthy lifestyle for LIFE: 1) Small portions. But, make sure to get regular (at least 3 per day), healthy meals and small healthy snacks if needed.  2) Eat a healthy clean diet.   TRY TO EAT: -at least 5-7 servings of low sugar, colorful, and nutrient rich vegetables per day (not corn, potatoes or bananas.) -berries are the best choice if you wish to eat fruit (only eat small amounts if trying to reduce weight)  -lean meets (fish, white meat of chicken or [place]) -vegan proteins for some meals - beans or tofu, whole grains, nuts and seeds -Replace bad fats with good fats - good fats include: fish, nuts and seeds, canola oil, olive oil -small amounts of low fat or non fat dairy -small amounts of100 % whole grains - check the lables -drink plenty of water  AVOID: -SUGAR, sweets, anything with added sugar, corn syrup or sweeteners - must read labels as even foods advertised as "healthy" often are loaded with sugar -if you must have a sweetener, small amounts of stevia may be best -sweetened beverages and artificially sweetened beverages -simple starches (rice, bread, potatoes, pasta, chips, etc - small amounts of 100% whole grains are ok) -red meat, pork, butter -fried foods, fast food, processed food, excessive dairy, eggs and coconut.  3)Get at least 150 minutes of sweaty aerobic exercise per week.  4)Reduce stress - consider counseling, meditation and relaxation to balance other aspects of your life.

## 2017-07-31 NOTE — Progress Notes (Signed)
HPI:  Using dictation device. Unfortunately this device frequently misinterprets words/phrases.  Alexandra Flores is a pleasant 72 y.o. here for follow up. Chronic medical problems summarized below were reviewed for changes. She is a smoker. Currently has a cold - sinus congestion, PND and some sinus discomfort for about 1 week. No fevers, hemoptysis, SOB, body aches. Denies CP, SOB, DOE, treatment intolerance or new symptoms. Due for mammo and colon ca screening (cologuard). Refuses vaccines, dexa. She agrees to call to schedule her mammogram. We ordered the cologuard test for her at her last wellness visit - she reports she received the kit, completed it and sent it. No results in epic.  AWV 03/29/17  HTN: -meds: norvasc, hctz lidinopril  HLD: -takes pravastatin 38m - poor compliance in the past  Tobacco Use: -counseled many times -really enjoys, has increased to 1 ppd  - she understands risks but does not want to quit currently -Refuses lung cancer screening  Overweight: -Bowling twice per week -Trying to improve diet, does eat a lot of sugar    ROS: See pertinent positives and negatives per HPI.  Past Medical History:  Diagnosis Date  . GERD (gastroesophageal reflux disease)   . Hyperlipemia 09/30/2015  . Hypertension   . Tobacco use 02/10/2012    Past Surgical History:  Procedure Laterality Date  . BREAST SURGERY     benign cyst removal    Family History  Problem Relation Age of Onset  . Stroke Mother   . Hypertension Mother   . Cancer Sister 51      'stomach cancer'     SOCIAL HX: see hpi, smoking   Current Outpatient Medications:  .  amLODipine (NORVASC) 10 MG tablet, Take 1 tablet (10 mg total) by mouth daily., Disp: 90 tablet, Rfl: 3 .  atorvastatin (LIPITOR) 20 MG tablet, Take 1 tablet (20 mg total) by mouth daily., Disp: 90 tablet, Rfl: 3 .  hydrochlorothiazide (HYDRODIURIL) 25 MG tablet, Take 1 tablet (25 mg total) by mouth daily., Disp: 90  tablet, Rfl: 3 .  lisinopril (PRINIVIL,ZESTRIL) 40 MG tablet, Take 1 tablet (40 mg total) by mouth daily., Disp: 90 tablet, Rfl: 3 .  triamcinolone cream (KENALOG) 0.1 %, Apply 1 application topically 2 (two) times daily., Disp: 45 g, Rfl: 3  EXAM:  Vitals:   08/01/17 1153  BP: 112/62  Pulse: 89  Temp: 98.3 F (36.8 C)    Body mass index is 26.84 kg/m.  GENERAL: vitals reviewed and listed above, alert, oriented, appears well hydrated and in no acute distress  HEENT: atraumatic, conjunttiva clear, no obvious abnormalities on inspection of external nose and ears, normal appearance of ear canals and TMs, clear nasal congestion, mild post oropharyngeal erythema with PND, no tonsillar edema or exudate, no sinus TTP  NECK: no obvious masses on inspection  LUNGS: clear to auscultation bilaterally, no wheezes, rales or rhonchi, good air movement  CV: HRRR, no peripheral edema  MS: moves all extremities without noticeable abnormality  PSYCH: pleasant and cooperative, no obvious depression or anxiety  ASSESSMENT AND PLAN:  Discussed the following assessment and plan:  Essential hypertension Hyperlipidemia, unspecified hyperlipidemia type -stable -labs next visit -cont current treatment  Tobacco use -have advised to quit and offered help  Rhinosinusitis -discussed etiologies, treatment, return precuations -symptomatic care, nasal saline, quit smoking -follow up if new concerns, worsening or not much better over the next 1 week  Colon cancer screening -advised assistant to check with cologuard and assist pt in  repeating if needed  Breast cancer screening -advised to complete, pt prefers to call to schedule on her own, number provided  -Patient advised to return or notify a doctor immediately if symptoms worsen or persist or new concerns arise.  Patient Instructions  BEFORE YOU LEAVE: -Alexandra Flores - check on cologuard test and instruct pt -follow up: 3-4 months, will do  labs then  Call today to schedule your mammogram for breast cancer screening  For the sinuses: -nasal saline twice daily -vix or topical menthol if needed -tylenol if needed -can use zyrtec or claritin if needed -follow up if worsening or if symptoms not resolved in 1 week -please quit smoking     Alexandra Kern, DO

## 2017-08-01 ENCOUNTER — Ambulatory Visit (INDEPENDENT_AMBULATORY_CARE_PROVIDER_SITE_OTHER): Payer: Medicare Other | Admitting: Family Medicine

## 2017-08-01 ENCOUNTER — Encounter: Payer: Self-pay | Admitting: Family Medicine

## 2017-08-01 VITALS — BP 112/62 | HR 89 | Temp 98.3°F | Ht 66.0 in | Wt 166.3 lb

## 2017-08-01 DIAGNOSIS — I10 Essential (primary) hypertension: Secondary | ICD-10-CM | POA: Diagnosis not present

## 2017-08-01 DIAGNOSIS — Z1211 Encounter for screening for malignant neoplasm of colon: Secondary | ICD-10-CM | POA: Diagnosis not present

## 2017-08-01 DIAGNOSIS — Z1239 Encounter for other screening for malignant neoplasm of breast: Secondary | ICD-10-CM

## 2017-08-01 DIAGNOSIS — Z1231 Encounter for screening mammogram for malignant neoplasm of breast: Secondary | ICD-10-CM

## 2017-08-01 DIAGNOSIS — Z720 Tobacco use: Secondary | ICD-10-CM

## 2017-08-01 DIAGNOSIS — E785 Hyperlipidemia, unspecified: Secondary | ICD-10-CM | POA: Diagnosis not present

## 2017-08-01 DIAGNOSIS — J329 Chronic sinusitis, unspecified: Secondary | ICD-10-CM

## 2017-08-01 NOTE — Patient Instructions (Addendum)
BEFORE YOU LEAVE: -Wendie Simmer - check on cologuard test and instruct pt -follow up: 3-4 months, will do labs then  Call today to schedule your mammogram for breast cancer screening  For the sinuses: -nasal saline twice daily -vix or topical menthol if needed -tylenol if needed -can use zyrtec or claritin if needed -follow up if worsening or if symptoms not resolved in 1 week -please quit smoking

## 2017-12-04 NOTE — Progress Notes (Signed)
HPI:  Using dictation device. Unfortunately this device frequently misinterprets words/phrases.  Alexandra Flores is a pleasant 72 y.o. here for follow up. Chronic medical problems summarized below were reviewed for changes and stability and were updated as needed below. These issues and their treatment remain stable for the most part.  Continues to smoke. Slightly more interested in possibly quitting. Motivation is husband is on her to quit. Though, still not ready and declines rx to help today. No symptoms. New issue of bilat knee pain L > R. On and off for rears, but worsening. Knees get stiff and painful when doesn't move, better after up and around. Occasionally takes husband's diclofenac about 1x per week for pain and it helps. No weakness, numbness, locking, catching, giving away, arthralgia elsewhere. Denies CP, SOB, DOE, treatment intolerance or new symptoms. Due for mammogram. ? Colonosocpy, labs Refuses vaccines AWV 03/29/17  HTN: -meds: norvasc, hctz, lisinopril  HLD: -takes pravastatin 20mg  - poor compliance in the past  Tobacco Use: -counseled many times -really enjoys, has increased to 1 ppd  - she understands risks but does not want to quit currently -Refuses lung cancer screening -nicotine patches caused irritation -husband wants her to quit  Overweight: -Bowling twice per week -Trying to improve diet, does eat a lot of sugar    ROS: See pertinent positives and negatives per HPI.  Past Medical History:  Diagnosis Date  . GERD (gastroesophageal reflux disease)   . Hyperlipemia 09/30/2015  . Hypertension   . Tobacco use 02/10/2012    Past Surgical History:  Procedure Laterality Date  . BREAST SURGERY     benign cyst removal    Family History  Problem Relation Age of Onset  . Stroke Mother   . Hypertension Mother   . Cancer Sister 68       'stomach cancer'     SOCIAL HX: see hpi   Current Outpatient Medications:  .  amLODipine (NORVASC) 10 MG  tablet, Take 1 tablet (10 mg total) by mouth daily., Disp: 90 tablet, Rfl: 3 .  atorvastatin (LIPITOR) 20 MG tablet, Take 1 tablet (20 mg total) by mouth daily., Disp: 90 tablet, Rfl: 3 .  hydrochlorothiazide (HYDRODIURIL) 25 MG tablet, Take 1 tablet (25 mg total) by mouth daily., Disp: 90 tablet, Rfl: 3 .  lisinopril (PRINIVIL,ZESTRIL) 40 MG tablet, Take 1 tablet (40 mg total) by mouth daily., Disp: 90 tablet, Rfl: 3 .  triamcinolone cream (KENALOG) 0.1 %, Apply 1 application topically 2 (two) times daily., Disp: 45 g, Rfl: 3  EXAM:  Vitals:   12/05/17 1020  BP: 128/70  Pulse: 87  Temp: 97.7 F (36.5 C)    Body mass index is 25.58 kg/m.  GENERAL: vitals reviewed and listed above, alert, oriented, appears well hydrated and in no acute distress  HEENT: atraumatic, conjunttiva clear, no obvious abnormalities on inspection of external nose and ears  NECK: no obvious masses on inspection  LUNGS: clear to auscultation bilaterally, no wheezes, rales or rhonchi, good air movement  CV: HRRR, no peripheral edema  MS: moves all extremities without noticeable abnormality, no appreciable effusion on inspection knees, bilat patellar crepitus, no TTP, neg lachman, drawer, mcmurry, val/var stress, nv intact distal. 2 PSYCH: pleasant and cooperative, no obvious depression or anxiety  ASSESSMENT AND PLAN:  Discussed the following assessment and plan:  Chronic pain of left knee - Plan: DG Knee Complete 4 Views Left, DG Knee Complete 4 Views Left  Tobacco use  Essential hypertension - Plan:  Basic metabolic panel, CBC  Hyperlipidemia, unspecified hyperlipidemia type - Plan: Lipid panel  -lengthy discussion knee pain and potential eto=iologies, options for eval an tx. Opted for xray worse knee. Tylenol prn. Regular gentle exercise. -smoking cessation counseling 5 minutes. Discussed medications to help and risks. Patches cause skin irritation. She declines help today. Agrees to call if  changes her mind. -labs, healthy diet, regular exercise advised -assistant advised to track down colon ca screening or schedule -follow up 1-2 months for knees, recheck bp -Patient advised to return or notify a doctor immediately if symptoms worsen or persist or new concerns arise.  Patient Instructions  BEFORE YOU LEAVE: -Wendie Simmer, colon cancer screening report or schedule -labs -xray -follow up: 1-2 months  Call today to schedule your mammogram for breast cancer screening  Please get the xray for the knee. Topical sports creams such as tiger balm or capsaicin products can help. Tylenol 500-1000mg  once daily if needed for pain. Healthy diet. Regular gentle exercise. Quit smoking.  Let us know if you want to try wellbutrin or chantix for the smoking.  We have ordered labs or studies at this visit. It can take up to 1-2 weeks for results and processing. IF results require follow up or explanation, we will call you with instructions. Clinically stable results will be released to your [facility]. If you have not heard from [place] or cannot find your results in [facility] in 2 weeks please contact our office at [phone].  If you are not yet signed up for [facility], please consider signing up.           [name], DO

## 2017-12-05 ENCOUNTER — Encounter: Payer: Self-pay | Admitting: Family Medicine

## 2017-12-05 ENCOUNTER — Ambulatory Visit (INDEPENDENT_AMBULATORY_CARE_PROVIDER_SITE_OTHER): Payer: Medicare Other | Admitting: Family Medicine

## 2017-12-05 ENCOUNTER — Ambulatory Visit (INDEPENDENT_AMBULATORY_CARE_PROVIDER_SITE_OTHER): Payer: Medicare Other

## 2017-12-05 VITALS — BP 128/70 | HR 87 | Temp 97.7°F | Ht 66.0 in | Wt 158.5 lb

## 2017-12-05 DIAGNOSIS — G8929 Other chronic pain: Secondary | ICD-10-CM

## 2017-12-05 DIAGNOSIS — Z720 Tobacco use: Secondary | ICD-10-CM

## 2017-12-05 DIAGNOSIS — M1712 Unilateral primary osteoarthritis, left knee: Secondary | ICD-10-CM | POA: Diagnosis not present

## 2017-12-05 DIAGNOSIS — E785 Hyperlipidemia, unspecified: Secondary | ICD-10-CM

## 2017-12-05 DIAGNOSIS — M25562 Pain in left knee: Secondary | ICD-10-CM

## 2017-12-05 DIAGNOSIS — I10 Essential (primary) hypertension: Secondary | ICD-10-CM

## 2017-12-05 LAB — BASIC METABOLIC PANEL
BUN: 11 mg/dL (ref 6–23)
CALCIUM: 9.7 mg/dL (ref 8.4–10.5)
CO2: 30 mEq/L (ref 19–32)
Chloride: 101 mEq/L (ref 96–112)
Creatinine, Ser: 0.71 mg/dL (ref 0.40–1.20)
GFR: 104.64 mL/min (ref >60.00)
GLUCOSE: 105 mg/dL — AB (ref 70–99)
POTASSIUM: 3.6 meq/L (ref 3.5–5.1)
SODIUM: 137 meq/L (ref 135–145)

## 2017-12-05 LAB — LIPID PANEL
CHOL/HDL RATIO: 6
Cholesterol: 173 mg/dL (ref 0–200)
HDL: 30.4 mg/dL — AB (ref >39.00)
LDL CALC: 112 mg/dL — AB (ref 0–99)
NONHDL: 142.97
TRIGLYCERIDES: 157 mg/dL — AB (ref 0.0–149.0)
VLDL: 31.4 mg/dL (ref 0.0–40.0)

## 2017-12-05 LAB — CBC
HCT: 40.1 % (ref 36.0–46.0)
HEMOGLOBIN: 13.3 g/dL (ref 12.0–15.0)
MCHC: 33.1 g/dL (ref 30.0–36.0)
MCV: 81.8 fl (ref 78.0–100.0)
PLATELETS: 304 10*3/uL (ref 150.0–400.0)
RBC: 4.9 Mil/uL (ref 3.87–5.11)
RDW: 15.8 % — ABNORMAL HIGH (ref 11.5–15.5)
WBC: 5.6 10*3/uL (ref 4.0–10.5)

## 2017-12-05 NOTE — Patient Instructions (Signed)
BEFORE YOU LEAVE: -Wendie Simmer, colon cancer screening report or schedule -labs -xray -follow up: 1-2 months  Call today to schedule your mammogram for breast cancer screening  Please get the xray for the knee. Topical sports creams such as tiger balm or capsaicin products can help. Tylenol 500-1000mg  once daily if needed for pain. Healthy diet. Regular gentle exercise. Quit smoking.  Let us know if you want to try wellbutrin or chantix for the smoking.  We have ordered labs or studies at this visit. It can take up to 1-2 weeks for results and processing. IF results require follow up or explanation, we will call you with instructions. Clinically stable results will be released to your [facility]. If you have not heard from [place] or cannot find your results in [facility] in 2 weeks please contact our office at [phone].  If you are not yet signed up for [facility], please consider signing up.

## 2018-01-28 NOTE — Progress Notes (Signed)
HPI:  Using dictation device. Unfortunately this device frequently misinterprets words/phrases.  Alexandra Flores is a pleasant 73 y.o. here for follow up. Chronic medical problems summarized below were reviewed for changes and stability and were updated as needed below. These issues and their treatment remain stable for the most part.  She unfortunately chooses to not get recommended preventive care and also chooses to continue to smoke despite [place] pleading with her to stop, advising of risk and offering assistance. She has been well informed of the risks of poor compliance. Today she refuses again mammo, vaccines, colon cancer screening, lung cancer screening and smoking cessation despite detailed discussion of rationale, risks of not doing, etc. She reports she has not been taking her medications daily. She has had difficulty swallowing pills since she was a child and reports it is all in her head as actually can swallow them, but doesn't like to. Denies CP, SOB, DOE, treatment intolerance or new symptoms.  AWV 03/29/17  HTN: -meds: norvasc, hctz, lisinopril  HLD: -takes pravastatin 20mg  - poor compliance in the past  Tobacco Use: -counseled many times -really enjoys, has increased to 1 ppd  - she understands risks but does not want to quit currently -Refuses lung cancer screening -nicotine patches caused irritation -husband wants her to quit  Overweight: -Bowling twice per week -Trying to improve diet, does eat a lot of sugar   ROS: See pertinent positives and negatives per HPI.  Past Medical History:  Diagnosis Date  . GERD (gastroesophageal reflux disease)   . Hyperlipemia 09/30/2015  . Hypertension   . Tobacco use 02/10/2012    Past Surgical History:  Procedure Laterality Date  . BREAST SURGERY     benign cyst removal    Family History  Problem Relation Age of Onset  . Stroke Mother   . Hypertension Mother   . Cancer Sister 42       'stomach cancer'      SOCIAL HX: see hpi, continues to smoke 1ppd, refuses help or quitting   Current Outpatient Medications:  .  amLODipine (NORVASC) 10 MG tablet, Take 1 tablet (10 mg total) by mouth daily., Disp: 90 tablet, Rfl: 3 .  atorvastatin (LIPITOR) 20 MG tablet, Take 1 tablet (20 mg total) by mouth daily., Disp: 90 tablet, Rfl: 3 .  hydrochlorothiazide (HYDRODIURIL) 25 MG tablet, Take 1 tablet (25 mg total) by mouth daily., Disp: 90 tablet, Rfl: 3 .  lisinopril (PRINIVIL,ZESTRIL) 40 MG tablet, Take 1 tablet (40 mg total) by mouth daily., Disp: 90 tablet, Rfl: 3 .  triamcinolone cream (KENALOG) 0.1 %, Apply 1 application topically 2 (two) times daily., Disp: 45 g, Rfl: 3  EXAM:  Vitals:   01/31/18 0945  BP: 124/84  Pulse: 79  Temp: 98.4 F (36.9 C)    Body mass index is 25.91 kg/m.  GENERAL: vitals reviewed and listed above, alert, oriented, appears well hydrated and in no acute distress  HEENT: atraumatic, conjunttiva clear, no obvious abnormalities on inspection of external nose and ears  NECK: no obvious masses on inspection  LUNGS: clear to auscultation bilaterally, no wheezes, rales or rhonchi, good air movement  CV: HRRR, no peripheral edema  MS: moves all extremities without noticeable abnormality  PSYCH: pleasant and cooperative, no obvious depression or anxiety  ASSESSMENT AND PLAN:  Discussed the following assessment and plan:  Hyperlipidemia, unspecified hyperlipidemia type  Essential hypertension - Plan: Basic metabolic panel, CBC  Tobacco use  Hyperglycemia - Plan: Hemoglobin A1c  -advised  of all health maintenance due - she refused -advised to quit smoking - refused -lifestyle recs -labs -AWV next visit -advised to take meds daily and discussed trying to take in small amount of applesauce or yogurt - she agrees try -Patient advised to return or notify a doctor immediately if symptoms worsen or persist or new concerns arise.  Patient Instructions   BEFORE YOU LEAVE: -labs -follow up: AWV and follow up in 3-4 months  Please take all of your medications every day.  Please eat a healthy diet and get regular exercise.  We advised, but you refused the following: Vaccines Mammogram for breast cancer screening Smoking cessation Lung cancer screening Colon cancer screening Please let us know if you change your mind and we would love to help you set these up to help keep you healthy!  We have ordered labs or studies at this visit. It can take up to 1-2 weeks for results and processing. IF results require follow up or explanation, we will call you with instructions. Clinically stable results will be released to your [facility]. If you have not heard from [place] or cannot find your results in [facility] in 2 weeks please contact our office at [phone].  If you are not yet signed up for [facility], please consider signing up.           [name], DO

## 2018-01-31 ENCOUNTER — Encounter: Payer: Self-pay | Admitting: Family Medicine

## 2018-01-31 ENCOUNTER — Ambulatory Visit (INDEPENDENT_AMBULATORY_CARE_PROVIDER_SITE_OTHER): Payer: Medicare Other | Admitting: Family Medicine

## 2018-01-31 VITALS — BP 124/84 | HR 79 | Temp 98.4°F | Ht 66.0 in | Wt 160.5 lb

## 2018-01-31 DIAGNOSIS — I10 Essential (primary) hypertension: Secondary | ICD-10-CM

## 2018-01-31 DIAGNOSIS — Z720 Tobacco use: Secondary | ICD-10-CM | POA: Diagnosis not present

## 2018-01-31 DIAGNOSIS — R739 Hyperglycemia, unspecified: Secondary | ICD-10-CM | POA: Diagnosis not present

## 2018-01-31 DIAGNOSIS — E785 Hyperlipidemia, unspecified: Secondary | ICD-10-CM

## 2018-01-31 LAB — BASIC METABOLIC PANEL
BUN: 10 mg/dL (ref 6–23)
CALCIUM: 9.8 mg/dL (ref 8.4–10.5)
CO2: 28 mEq/L (ref 19–32)
CREATININE: 0.65 mg/dL (ref 0.40–1.20)
Chloride: 104 mEq/L (ref 96–112)
GFR: 115.81 mL/min (ref >60.00)
Glucose, Bld: 94 mg/dL (ref 70–99)
Potassium: 3.9 mEq/L (ref 3.5–5.1)
SODIUM: 140 meq/L (ref 135–145)

## 2018-01-31 LAB — CBC
HCT: 38.9 % (ref 36.0–46.0)
Hemoglobin: 13 g/dL (ref 12.0–15.0)
MCHC: 33.5 g/dL (ref 30.0–36.0)
MCV: 82.3 fl (ref 78.0–100.0)
PLATELETS: 304 10*3/uL (ref 150.0–400.0)
RBC: 4.73 Mil/uL (ref 3.87–5.11)
RDW: 15.3 % (ref 11.5–15.5)
WBC: 4.9 10*3/uL (ref 4.0–10.5)

## 2018-01-31 LAB — HEMOGLOBIN A1C: HEMOGLOBIN A1C: 6.1 % (ref 4.6–6.5)

## 2018-01-31 NOTE — Patient Instructions (Signed)
BEFORE YOU LEAVE: -labs -follow up: AWV and follow up in 3-4 months  Please take all of your medications every day.  Please eat a healthy diet and get regular exercise.  We advised, but you refused the following: Vaccines Mammogram for breast cancer screening Smoking cessation Lung cancer screening Colon cancer screening Please let us know if you change your mind and we would love to help you set these up to help keep you healthy!  We have ordered labs or studies at this visit. It can take up to 1-2 weeks for results and processing. IF results require follow up or explanation, we will call you with instructions. Clinically stable results will be released to your [facility]. If you have not heard from [place] or cannot find your results in [facility] in 2 weeks please contact our office at [phone].  If you are not yet signed up for [facility], please consider signing up.

## 2018-04-15 ENCOUNTER — Other Ambulatory Visit: Payer: Self-pay | Admitting: Family Medicine

## 2018-04-15 MED ORDER — ATORVASTATIN CALCIUM 20 MG PO TABS: 20.0000 mg | ORAL_TABLET | Freq: Every day | ORAL | 1 refills | Status: DC

## 2018-04-15 MED ORDER — HYDROCHLOROTHIAZIDE 25 MG PO TABS: 25.0000 mg | ORAL_TABLET | Freq: Every day | ORAL | 1 refills | Status: DC

## 2018-04-15 MED ORDER — AMLODIPINE BESYLATE 10 MG PO TABS: 10.0000 mg | ORAL_TABLET | Freq: Every day | ORAL | 1 refills | Status: DC

## 2018-04-15 MED ORDER — LISINOPRIL 40 MG PO TABS: 40.0000 mg | ORAL_TABLET | Freq: Every day | ORAL | 1 refills | Status: DC

## 2018-05-24 ENCOUNTER — Telehealth: Payer: Self-pay | Admitting: Family Medicine

## 2018-05-24 NOTE — Telephone Encounter (Signed)
Pt is requesting a refill on atorvastatin and lisinopril (cholesterol and Blood pressure meds) pt could not remember what the names of the medication were but need them.  Pharm:  [facility].  Pt stated that she did not want to do a virtual TOC appointment she just wanted to wait until sometime in July for that.

## 2018-05-30 ENCOUNTER — Telehealth: Payer: Self-pay | Admitting: Family Medicine

## 2018-05-30 NOTE — Telephone Encounter (Signed)
Copied from [place] [id number]. Topic: Quick Communication - Rx Refill/Question >> May 30, 2018 10:57 AM Gustavus Messing wrote: Medication: amLODipine (NORVASC) 10 MG tablet  atorvastatin (LIPITOR) 20 MG tablet  lisinopril (PRINIVIL,ZESTRIL) 40 MG tablet  hydrochlorothiazide (HYDRODIURIL) 25 MG tablet   Has the patient contacted their pharmacy? No. (Agent: If no, request that the patient contact the pharmacy for the refill.) The patient stated that she needs new scripts for all of these medications   Preferred Pharmacy (with phone number or street name): [id number] [address], [place]. [phone] (Phone) [phone] (Fax)    Agent: Please be advised that RX refills may take up to 3 business days. We ask that you follow-up with your pharmacy.

## 2018-05-31 MED ORDER — AMLODIPINE BESYLATE 10 MG PO TABS: 10.0000 mg | ORAL_TABLET | Freq: Every day | ORAL | 1 refills | Status: DC

## 2018-05-31 MED ORDER — ATORVASTATIN CALCIUM 20 MG PO TABS: 20.0000 mg | ORAL_TABLET | Freq: Every day | ORAL | 1 refills | Status: DC

## 2018-05-31 MED ORDER — LISINOPRIL 40 MG PO TABS: 40.0000 mg | ORAL_TABLET | Freq: Every day | ORAL | 1 refills | Status: DC

## 2018-05-31 MED ORDER — HYDROCHLOROTHIAZIDE 25 MG PO TABS: 25.0000 mg | ORAL_TABLET | Freq: Every day | ORAL | 1 refills | Status: DC

## 2018-05-31 NOTE — Telephone Encounter (Signed)
Rx done. 

## 2018-06-05 ENCOUNTER — Ambulatory Visit: Payer: Medicare Other | Admitting: Family Medicine

## 2018-07-31 ENCOUNTER — Encounter: Payer: Self-pay | Admitting: Family Medicine

## 2018-07-31 ENCOUNTER — Ambulatory Visit (INDEPENDENT_AMBULATORY_CARE_PROVIDER_SITE_OTHER): Payer: Medicare Other | Admitting: Family Medicine

## 2018-07-31 ENCOUNTER — Other Ambulatory Visit: Payer: Self-pay

## 2018-07-31 VITALS — BP 140/80 | HR 94 | Temp 99.0°F | Ht 66.0 in | Wt 163.3 lb

## 2018-07-31 DIAGNOSIS — E785 Hyperlipidemia, unspecified: Secondary | ICD-10-CM

## 2018-07-31 DIAGNOSIS — R739 Hyperglycemia, unspecified: Secondary | ICD-10-CM | POA: Diagnosis not present

## 2018-07-31 DIAGNOSIS — I10 Essential (primary) hypertension: Secondary | ICD-10-CM | POA: Diagnosis not present

## 2018-07-31 DIAGNOSIS — Z720 Tobacco use: Secondary | ICD-10-CM

## 2018-07-31 LAB — CBC WITH DIFFERENTIAL/PLATELET
Basophils Absolute: 0 10*3/uL (ref 0.0–0.1)
Basophils Relative: 1 % (ref 0.0–3.0)
Eosinophils Absolute: 0.1 10*3/uL (ref 0.0–0.7)
Eosinophils Relative: 1.6 % (ref 0.0–5.0)
HCT: 39.8 % (ref 36.0–46.0)
Hemoglobin: 13 g/dL (ref 12.0–15.0)
Lymphocytes Relative: 41.9 % (ref 12.0–46.0)
Lymphs Abs: 2.1 10*3/uL (ref 0.7–4.0)
MCHC: 32.6 g/dL (ref 30.0–36.0)
MCV: 83.3 fl (ref 78.0–100.0)
Monocytes Absolute: 0.4 10*3/uL (ref 0.1–1.0)
Monocytes Relative: 7.3 % (ref 3.0–12.0)
Neutro Abs: 2.5 10*3/uL (ref 1.4–7.7)
Neutrophils Relative %: 48.2 % (ref 43.0–77.0)
Platelets: 282 10*3/uL (ref 150.0–400.0)
RBC: 4.78 Mil/uL (ref 3.87–5.11)
RDW: 15.3 % (ref 11.5–15.5)
WBC: 5.1 10*3/uL (ref 4.0–10.5)

## 2018-07-31 LAB — COMPREHENSIVE METABOLIC PANEL
ALT: 9 U/L (ref 0–35)
AST: 12 U/L (ref 0–37)
Albumin: 4.5 g/dL (ref 3.5–5.2)
Alkaline Phosphatase: 75 U/L (ref 39–117)
BUN: 10 mg/dL (ref 6–23)
CO2: 28 mEq/L (ref 19–32)
Calcium: 9.7 mg/dL (ref 8.4–10.5)
Chloride: 103 mEq/L (ref 96–112)
Creatinine, Ser: 0.67 mg/dL (ref 0.40–1.20)
GFR: 105.07 mL/min (ref >60.00)
Glucose, Bld: 89 mg/dL (ref 70–99)
Potassium: 3.8 mEq/L (ref 3.5–5.1)
Sodium: 139 mEq/L (ref 135–145)
Total Bilirubin: 0.4 mg/dL (ref 0.2–1.2)
Total Protein: 7.7 g/dL (ref 6.0–8.3)

## 2018-07-31 LAB — LIPID PANEL
Cholesterol: 150 mg/dL (ref 0–200)
HDL: 30.9 mg/dL — ABNORMAL LOW (ref >39.00)
LDL Cholesterol: 96 mg/dL (ref 0–99)
NonHDL: 119.1
Total CHOL/HDL Ratio: 5
Triglycerides: 115 mg/dL (ref 0.0–149.0)
VLDL: 23 mg/dL (ref 0.0–40.0)

## 2018-07-31 LAB — HEMOGLOBIN A1C: Hgb A1c MFr Bld: 6.2 % (ref 4.6–6.5)

## 2018-07-31 MED ORDER — AMLODIPINE-VALSARTAN-HCTZ 10-160-25 MG PO TABS: 1.0000 | ORAL_TABLET | Freq: Every day | ORAL | 1 refills | Status: DC

## 2018-07-31 NOTE — Progress Notes (Signed)
Alexandra Flores DOB: 07/29/1945 Encounter date: 07/31/2018  This isa 73 y.o. female who presents for transfer of care. Chief Complaint  Patient presents with  . Establish Care   Last bloodwork 01/2018 - stable; although A1C elevated to 6.1 Per HK last note she refuses many preventative care measures like mammogram, vaccines, colon cancer screening, lung cancer screening, and smoking cessation and admits to not taking medications regularly. Did re-visit with patient. States that she doesn't have reason not to do this, and has done some of these in past.   History of present illness:  HTN: amlodipine 10mg , hctz 25mg , lisinopril 40mg ; Does check at home sometimes. Not good about taking medications daily. Not always checking regularly at home - numbers vary (not sure what they are). No side effects from medications. Does [facility] better because size is smaller compared with those from [place].   HL: lipitor 20mg   GERD: doesn't usually have issue with this, but knows which food trigger her and takes OTC medication if needed.   Tobacco use: 1 PPD. Likes smoking. Doesn't want to quit. Husband would like for her to quit.    Past Medical History:  Diagnosis Date  . GERD (gastroesophageal reflux disease)   . Hyperlipemia 09/30/2015  . Hypertension   . Tobacco use 02/10/2012   Past Surgical History:  Procedure Laterality Date  . BREAST SURGERY     benign cyst removal   No Known Allergies Current Meds  Medication Sig  . atorvastatin (LIPITOR) 20 MG tablet Take 1 tablet (20 mg total) by mouth daily.  Marland Kitchen triamcinolone cream (KENALOG) 0.1 % Apply 1 application topically 2 (two) times daily.  . [DISCONTINUED] amLODipine (NORVASC) 10 MG tablet Take 1 tablet (10 mg total) by mouth daily.  . [DISCONTINUED] hydrochlorothiazide (HYDRODIURIL) 25 MG tablet Take 1 tablet (25 mg total) by mouth daily.  . [DISCONTINUED] lisinopril (ZESTRIL) 40 MG tablet Take 1 tablet (40 mg total) by mouth daily.    Social History   Tobacco Use  . Smoking status: Current Every Day Smoker    Packs/day: 0.80    Years: 48.00    Pack years: 38.40    Types: Cigarettes  . Smokeless tobacco: Never Used  . Tobacco comment: per patient not even a pack a day; takes 3 puffs and puts it out   Substance Use Topics  . Alcohol use: Yes    Alcohol/week: 0.0 standard drinks    Comment: occ    Family History  Problem Relation Age of Onset  . Stroke Mother 18  . Hypertension Mother   . Cancer Sister 80       'stomach cancer'   . Arthritis Father   . Eczema Father   . HIV/AIDS Brother   . Asthma Sister      Review of Systems  Constitutional: Negative for chills, fatigue and fever.  Respiratory: Negative for cough, chest tightness, shortness of breath and wheezing.   Cardiovascular: Negative for chest pain, palpitations and leg swelling.    Objective:  BP 140/80 (BP Location: Left Arm, Patient Position: Sitting, Cuff Size: Normal)   Pulse 94   Temp 99 F (37.2 C) (Temporal)   Ht 5\' 6"  (1.676 m)   Wt 163 lb 4.8 oz (74.1 kg)   BMI 26.36 kg/m   Weight: 163 lb 4.8 oz (74.1 kg)   BP Readings from Last 3 Encounters:  07/31/18 140/80  01/31/18 124/84  12/05/17 128/70   Wt Readings from Last 3 Encounters:  07/31/18  163 lb 4.8 oz (74.1 kg)  01/31/18 160 lb 8 oz (72.8 kg)  12/05/17 158 lb 8 oz (71.9 kg)    Physical Exam Constitutional:      General: She is not in acute distress.    Appearance: She is well-developed.  Cardiovascular:     Rate and Rhythm: Normal rate and regular rhythm.     Heart sounds: Normal heart sounds. No murmur. No friction rub.  Pulmonary:     Effort: Pulmonary effort is normal. No respiratory distress.     Breath sounds: Normal breath sounds. No wheezing or rales.  Musculoskeletal:     Right lower leg: No edema.     Left lower leg: No edema.  Neurological:     Mental Status: She is alert and oriented to person, place, and time.  Psychiatric:        Behavior:  Behavior normal.     Assessment/Plan:  1. Essential hypertension She is not good about taking her medications as prescribed.  Biggest issue with taking medications is that she does not like taking multiple pills and sometimes has difficulty with swallowing larger size pills.  We discussed options today, and she is very agreeable to changing over medications to a "3 in 1" type of blood pressure medication.  She does have good prescription coverage through the [place].  She feels that having all 3 medications and 1 pill will really help her with compliance.  I have encouraged her to check her blood pressure at home.  We will follow-up in 3 months to do a blood pressure recheck. - CBC with Differential/Platelet; Future - Comprehensive metabolic panel; Future - amLODIPine-Valsartan-HCTZ 10-160-25 MG TABS; Take 1 tablet by mouth daily.  Dispense: 90 tablet; Refill: 1 - Comprehensive metabolic panel - CBC with Differential/Platelet  2. Hyperlipidemia, unspecified hyperlipidemia type Continue Lipitor 20 mg.  Blood work today. - Lipid panel; Future - Lipid panel  3. Tobacco use She is not interested in quitting smoking at this time.  She enjoys smoking.  We talked extensively about this.  She understands that there are options to help with quitting including medications, but she is not ready to quit at this time.  She understands risks of smoking including increased risk of cancer, heart attack, stroke.  4. Hyperglycemia - Hemoglobin A1c; Future - Hemoglobin A1c  Return in about 3 months (around 10/31/2018) for Chronic condition visit/physical.  Micheline Rough, MD

## 2018-07-31 NOTE — Patient Instructions (Signed)

## 2018-11-01 ENCOUNTER — Ambulatory Visit: Payer: Medicare Other | Admitting: Family Medicine

## 2018-11-01 DIAGNOSIS — Z0289 Encounter for other administrative examinations: Secondary | ICD-10-CM

## 2018-11-01 NOTE — Progress Notes (Deleted)
  ALIZEY MACKS DOB: [birth year] Encounter date: 11/01/2018  This is a 73 y.o. female who presents with No chief complaint on file.   History of present illness: Last visit we discussed med compliance and switched her to [facility] combination medication.    HPI   No Known Allergies No outpatient medications have been marked as taking for the 11/01/18 encounter (Appointment) with Caren Macadam, MD.    Review of Systems  Objective:  There were no vitals taken for this visit.      BP Readings from Last 3 Encounters:  07/31/18 140/80  01/31/18 124/84  12/05/17 128/70   Wt Readings from Last 3 Encounters:  07/31/18 163 lb 4.8 oz (74.1 kg)  01/31/18 160 lb 8 oz (72.8 kg)  12/05/17 158 lb 8 oz (71.9 kg)    Physical Exam  Assessment/Plan  There are no diagnoses linked to this encounter.       Micheline Rough, MD

## 2019-03-15 ENCOUNTER — Ambulatory Visit: Payer: Medicare Other | Attending: Internal Medicine

## 2019-03-15 DIAGNOSIS — Z23 Encounter for immunization: Secondary | ICD-10-CM

## 2019-03-15 NOTE — Progress Notes (Signed)
   Covid-19 Vaccination Clinic  Name:  Alexandra Flores    MRN: [id number] DOB: Sep 10, 1945  03/15/2019  Alexandra Flores was observed post Covid-19 immunization for 15 minutes without incidence. She was provided with Vaccine Information Sheet and instruction to access the V-Safe system.   Alexandra Flores was instructed to call 911 with any severe reactions post vaccine: Marland Kitchen Difficulty breathing  . Swelling of your face and throat  . A fast heartbeat  . A bad rash all over your body  . Dizziness and weakness    Immunizations Administered    Name Date Dose VIS Date Route   Pfizer COVID-19 Vaccine 03/15/2019  2:02 PM 0.3 mL 12/29/2018 Intramuscular   Manufacturer: [place]   Lot: [id number]   [place]: [id number]

## 2019-03-19 ENCOUNTER — Ambulatory Visit: Payer: Medicare Other

## 2019-03-20 ENCOUNTER — Other Ambulatory Visit: Payer: Self-pay

## 2019-03-21 ENCOUNTER — Encounter: Payer: Self-pay | Admitting: Family Medicine

## 2019-03-21 ENCOUNTER — Ambulatory Visit (INDEPENDENT_AMBULATORY_CARE_PROVIDER_SITE_OTHER): Payer: Medicare Other | Admitting: Family Medicine

## 2019-03-21 VITALS — BP 140/76 | HR 85 | Temp 97.3°F | Ht 66.0 in | Wt 164.0 lb

## 2019-03-21 DIAGNOSIS — Z720 Tobacco use: Secondary | ICD-10-CM

## 2019-03-21 DIAGNOSIS — E785 Hyperlipidemia, unspecified: Secondary | ICD-10-CM

## 2019-03-21 DIAGNOSIS — I10 Essential (primary) hypertension: Secondary | ICD-10-CM | POA: Diagnosis not present

## 2019-03-21 DIAGNOSIS — R8789 Other abnormal findings in specimens from female genital organs: Secondary | ICD-10-CM

## 2019-03-21 DIAGNOSIS — R252 Cramp and spasm: Secondary | ICD-10-CM

## 2019-03-21 DIAGNOSIS — E559 Vitamin D deficiency, unspecified: Secondary | ICD-10-CM

## 2019-03-21 DIAGNOSIS — R739 Hyperglycemia, unspecified: Secondary | ICD-10-CM

## 2019-03-21 DIAGNOSIS — R87618 Other abnormal cytological findings on specimens from cervix uteri: Secondary | ICD-10-CM

## 2019-03-21 LAB — CBC WITH DIFFERENTIAL/PLATELET
Basophils Absolute: 0 10*3/uL (ref 0.0–0.1)
Basophils Relative: 0.6 % (ref 0.0–3.0)
Eosinophils Absolute: 0.1 10*3/uL (ref 0.0–0.7)
Eosinophils Relative: 1.7 % (ref 0.0–5.0)
HCT: 37.6 % (ref 36.0–46.0)
Hemoglobin: 12.5 g/dL (ref 12.0–15.0)
Lymphocytes Relative: 33.5 % (ref 12.0–46.0)
Lymphs Abs: 1.9 10*3/uL (ref 0.7–4.0)
MCHC: 33.3 g/dL (ref 30.0–36.0)
MCV: 82.9 fl (ref 78.0–100.0)
Monocytes Absolute: 0.4 10*3/uL (ref 0.1–1.0)
Monocytes Relative: 7 % (ref 3.0–12.0)
Neutro Abs: 3.2 10*3/uL (ref 1.4–7.7)
Neutrophils Relative %: 57.2 % (ref 43.0–77.0)
Platelets: 275 10*3/uL (ref 150.0–400.0)
RBC: 4.54 Mil/uL (ref 3.87–5.11)
RDW: 15.4 % (ref 11.5–15.5)
WBC: 5.5 10*3/uL (ref 4.0–10.5)

## 2019-03-21 LAB — LIPID PANEL
Cholesterol: 156 mg/dL (ref 0–200)
HDL: 29.1 mg/dL — ABNORMAL LOW (ref >39.00)
LDL Cholesterol: 99 mg/dL (ref 0–99)
NonHDL: 126.57
Total CHOL/HDL Ratio: 5
Triglycerides: 139 mg/dL (ref 0.0–149.0)
VLDL: 27.8 mg/dL (ref 0.0–40.0)

## 2019-03-21 LAB — COMPREHENSIVE METABOLIC PANEL
ALT: 8 U/L (ref 0–35)
AST: 12 U/L (ref 0–37)
Albumin: 4.1 g/dL (ref 3.5–5.2)
Alkaline Phosphatase: 84 U/L (ref 39–117)
BUN: 8 mg/dL (ref 6–23)
CO2: 29 mEq/L (ref 19–32)
Calcium: 9.7 mg/dL (ref 8.4–10.5)
Chloride: 102 mEq/L (ref 96–112)
Creatinine, Ser: 0.63 mg/dL (ref 0.40–1.20)
GFR: 112.6 mL/min (ref >60.00)
Glucose, Bld: 90 mg/dL (ref 70–99)
Potassium: 3.5 mEq/L (ref 3.5–5.1)
Sodium: 139 mEq/L (ref 135–145)
Total Bilirubin: 0.5 mg/dL (ref 0.2–1.2)
Total Protein: 7.7 g/dL (ref 6.0–8.3)

## 2019-03-21 LAB — VITAMIN D 25 HYDROXY (VIT D DEFICIENCY, FRACTURES): VITD: 9.27 ng/mL — ABNORMAL LOW (ref 30.00–100.00)

## 2019-03-21 LAB — TSH: TSH: 0.86 u[IU]/mL (ref 0.35–4.50)

## 2019-03-21 LAB — HEMOGLOBIN A1C: Hgb A1c MFr Bld: 6 % (ref 4.6–6.5)

## 2019-03-21 MED ORDER — AMLODIPINE-VALSARTAN-HCTZ 10-160-25 MG PO TABS: 1.0000 | ORAL_TABLET | Freq: Every day | ORAL | 1 refills | Status: DC

## 2019-03-21 NOTE — Patient Instructions (Signed)
Please continue to check blood pressures at home. Please let me know if staying elevated (135 or higher regularly)

## 2019-03-21 NOTE — Addendum Note (Signed)
Addended by: Elmer Picker on: 03/21/2019 10:19 AM   Modules accepted: Orders

## 2019-03-21 NOTE — Progress Notes (Signed)
Alexandra Flores DOB: 1945/07/23 Encounter date: 03/21/2019  This is a 74 y.o. female who presents for chronic condition follow-up/ physical   History of present illness/Additional concerns:  Hypertension: At last visit we converted medications to amlodipine valsartan hydrochlorothiazide combination to help with medication compliance. She wasn't able to get the combination medication, so has been taking the amlodipine, hctz, benazepril separately. Last check at home was 130/80.   Hyperlipidemia: Lipitor 20 mg. No problems with this medication. Notes cramps only if wearing heels that evening. Takes mustard when this happens.   GERD: OTC medication as needed  Tobacco use:  Last Pap was in 2014 and cytology was negative, but HPV was positive. Patient states that she saw gynecologist in follow up and had colposcopy. Not sure of provider name.   Does self exam for breasts. No concerns. Doesn't want mammogram.   Had colonoscopy years ago. Not interested in this now. Has kit at home (cologuard).   Past Medical History:  Diagnosis Date  . GERD (gastroesophageal reflux disease)   . Hyperlipemia 09/30/2015  . Hypertension   . Tobacco use 02/10/2012   Past Surgical History:  Procedure Laterality Date  . BREAST SURGERY     benign cyst removal   No Known Allergies Current Meds  Medication Sig  . amLODIPine-Valsartan-HCTZ 10-160-25 MG TABS Take 1 tablet by mouth daily.  Marland Kitchen atorvastatin (LIPITOR) 20 MG tablet Take 1 tablet (20 mg total) by mouth daily.  Marland Kitchen triamcinolone cream (KENALOG) 0.1 % Apply 1 application topically 2 (two) times daily.  . [DISCONTINUED] amLODIPine-Valsartan-HCTZ 10-160-25 MG TABS Take 1 tablet by mouth daily.   Social History   Tobacco Use  . Smoking status: Current Every Day Smoker    Packs/day: 0.80    Years: 48.00    Pack years: 38.40    Types: Cigarettes  . Smokeless tobacco: Never Used  . Tobacco comment: per patient not even a pack a day; takes 3 puffs and  puts it out   Substance Use Topics  . Alcohol use: Yes    Alcohol/week: 0.0 standard drinks    Comment: occ    Family History  Problem Relation Age of Onset  . Stroke Mother 67  . Hypertension Mother   . Cancer Sister 28       'stomach cancer'   . Arthritis Father   . Eczema Father   . HIV/AIDS Brother   . Asthma Sister      Review of Systems  Constitutional: Negative for activity change, appetite change, chills, fatigue, fever and unexpected weight change.  HENT: Negative for congestion, ear pain, hearing loss, sinus pressure, sinus pain, sore throat and trouble swallowing.   Eyes: Negative for pain and visual disturbance.  Respiratory: Negative for cough, chest tightness, shortness of breath and wheezing.   Cardiovascular: Negative for chest pain, palpitations and leg swelling.  Gastrointestinal: Negative for abdominal pain, blood in stool, constipation, diarrhea, nausea and vomiting.  Genitourinary: Negative for difficulty urinating and menstrual problem.  Musculoskeletal: Negative for arthralgias and back pain.  Skin: Negative for rash.  Neurological: Negative for dizziness, weakness, numbness and headaches.  Hematological: Negative for adenopathy. Does not bruise/bleed easily.  Psychiatric/Behavioral: Negative for sleep disturbance and suicidal ideas. The patient is not nervous/anxious.     CBC:  Lab Results  Component Value Date   WBC 5.1 07/31/2018   HGB 13.0 07/31/2018   HCT 39.8 07/31/2018   MCH 27.9 07/04/2015   MCHC 32.6 07/31/2018   RDW 15.3 07/31/2018  PLT 282.0 07/31/2018   MPV 6.7 07/04/2015   CMP: Lab Results  Component Value Date   NA 139 07/31/2018   K 3.8 07/31/2018   CL 103 07/31/2018   CO2 28 07/31/2018   GLUCOSE 89 07/31/2018   BUN 10 07/31/2018   CREATININE 0.67 07/31/2018   CALCIUM 9.7 07/31/2018   PROT 7.7 07/31/2018   BILITOT 0.4 07/31/2018   ALKPHOS 75 07/31/2018   ALT 9 07/31/2018   AST 12 07/31/2018   LIPID: Lab Results   Component Value Date   CHOL 150 07/31/2018   TRIG 115.0 07/31/2018   HDL 30.90 (L) 07/31/2018   LDLCALC 96 07/31/2018    Objective:  BP (!) 150/80 (BP Location: Left Arm, Patient Position: Sitting, Cuff Size: Normal)   Pulse 85   Temp (!) 97.3 F (36.3 C) (Temporal)   Ht '5\' 6"'  (1.676 m)   Wt 164 lb (74.4 kg)   SpO2 99%   BMI 26.47 kg/m   Weight: 164 lb (74.4 kg)   BP Readings from Last 3 Encounters:  03/21/19 (!) 150/80  07/31/18 140/80  01/31/18 124/84   Wt Readings from Last 3 Encounters:  03/21/19 164 lb (74.4 kg)  07/31/18 163 lb 4.8 oz (74.1 kg)  01/31/18 160 lb 8 oz (72.8 kg)    Physical Exam  Assessment/Plan: Health Maintenance Due  Topic Date Due  . MAMMOGRAM  04/27/2014   Health Maintenance reviewed - she declines mammogram, colon cancer screening, repeat pap. She is aware of benefits of screening tests including early detection of cancer. She is aware of previous abnormal pap. She is aware of her increased risk of cancer with smoking history.   1. Essential hypertension We are going to switch to combination medication.  - amLODIPine-Valsartan-HCTZ 10-160-25 MG TABS; Take 1 tablet by mouth daily.  Dispense: 90 tablet; Refill: 1  2. Hyperlipidemia, unspecified hyperlipidemia type Has been well controlled. Continue current meds.  3. Tobacco use Not interested in quitting smoking. Patient aware of options for help with quitting if desired.  4. Pap smear abnormality of cervix/human papillomavirus (HPV) positive Patient states she had follow up with gyn. Does not wish to complete further eval.  5. Leg cramps Checking bloodwork  6. Hyperglycemia Recheck A1C  Return in about 1 month (around 04/21/2019) for blood pressure recheck; ok for virtual if patient able to check pressures at home.  Micheline Rough, MD

## 2019-03-23 LAB — EXTRA SPECIMEN

## 2019-03-23 LAB — MAGNESIUM, RBC: Magnesium RBC: 5.5 mg/dL (ref 4.0–6.4)

## 2019-03-23 MED ORDER — VITAMIN D (ERGOCALCIFEROL) 1.25 MG (50000 UNIT) PO CAPS: 50000.0000 [IU] | ORAL_CAPSULE | ORAL | 1 refills | Status: DC

## 2019-03-23 NOTE — Addendum Note (Signed)
Addended by: Lahoma Crocker A on: 03/23/2019 11:00 AM   Modules accepted: Orders

## 2019-04-10 ENCOUNTER — Ambulatory Visit: Payer: Medicare Other | Attending: Internal Medicine

## 2019-04-10 DIAGNOSIS — Z23 Encounter for immunization: Secondary | ICD-10-CM

## 2019-04-10 NOTE — Progress Notes (Signed)
   Covid-19 Vaccination [facility]  Name:  APIPHANY TROCHEZ    MRN: [id number] DOB: 06/03/1945  04/10/2019  Ms. Golin was observed post Covid-19 immunization for 15 minutes without incident. She was provided with Vaccine Information Sheet and instruction to access the V-Safe system.   Ms. Zettel was instructed to call 911 with any severe reactions post vaccine: Marland Kitchen Difficulty breathing  . Swelling of face and throat  . A fast heartbeat  . A bad rash all over body  . Dizziness and weakness   Immunizations Administered    Name Date Dose VIS Date Route   Pfizer COVID-19 Vaccine 04/10/2019  1:57 PM 0.3 mL 12/29/2018 Intramuscular   Manufacturer: [place]   Lot: [id number]   [employer]: [id number]

## 2019-04-25 ENCOUNTER — Telehealth: Payer: Medicare Other | Admitting: Family Medicine

## 2019-05-10 ENCOUNTER — Other Ambulatory Visit: Payer: Self-pay

## 2019-05-10 NOTE — Progress Notes (Signed)
Alexandra Flores DOB: Oct 09, 1945 Encounter date: 05/11/2019  This is a 74 y.o. female who presents with Chief Complaint  Patient presents with  . Follow-up  . Anxiety    patient requests a medication to help keep her calm during upcoming ride to [place] with her husband    History of present illness: Taking a trip with husband and doesn't like traffic. Has taken lorazepam in past which has helped. She is leaving on Thursday and coming back on Tuesday. Going for a birthday party.   [facility] hasn't [facility] the combination bp medication yet. States that pressures at home have been about what today is. Currently taking amlodipine 10mg , lisinopril 40mg  and hctz 25mg .     No Known Allergies Current Meds  Medication Sig  . amLODIPine-Valsartan-HCTZ 10-160-25 MG TABS Take 1 tablet by mouth daily.  Marland Kitchen atorvastatin (LIPITOR) 20 MG tablet Take 1 tablet (20 mg total) by mouth daily.  Marland Kitchen triamcinolone cream (KENALOG) 0.1 % Apply 1 application topically 2 (two) times daily.  . Vitamin D, Ergocalciferol, (DRISDOL) 1.25 MG (50000 UNIT) CAPS capsule Take 1 capsule (50,000 Units total) by mouth every 7 (seven) days.    Review of Systems  Constitutional: Negative for chills, fatigue and fever.  Respiratory: Negative for cough, chest tightness, shortness of breath and wheezing.   Cardiovascular: Negative for chest pain, palpitations and leg swelling.    Objective:  BP (!) 150/88 (BP Location: Left Arm, Patient Position: Sitting, Cuff Size: Large)   Pulse 62   Temp 97.7 F (36.5 C) (Temporal)   Ht 5\' 6"  (1.676 m)   Wt 166 lb 4.8 oz (75.4 kg)   SpO2 98%   BMI 26.84 kg/m   Weight: 166 lb 4.8 oz (75.4 kg)   BP Readings from Last 3 Encounters:  05/11/19 (!) 150/88  03/21/19 140/76  07/31/18 140/80   Wt Readings from Last 3 Encounters:  05/11/19 166 lb 4.8 oz (75.4 kg)  03/21/19 164 lb (74.4 kg)  07/31/18 163 lb 4.8 oz (74.1 kg)    Physical Exam Constitutional:      General: She is not in acute  distress.    Appearance: She is well-developed.  Cardiovascular:     Rate and Rhythm: Normal rate and regular rhythm.     Heart sounds: Normal heart sounds. No murmur. No friction rub.  Pulmonary:     Effort: Pulmonary effort is normal. No respiratory distress.     Breath sounds: Normal breath sounds. No wheezing or rales.  Musculoskeletal:     Right lower leg: No edema.     Left lower leg: No edema.  Neurological:     Mental Status: She is alert and oriented to person, place, and time.  Psychiatric:        Behavior: Behavior normal.     Comments: Has always had harder time with driving. Gets anxious, sweaty, heart racing. Worries about other drivers. She is very anxious about long road trip to [place]. Husband will drive, but it makes her anxious to be in car for those long periods.     Assessment/Plan  1. Essential hypertension suboptimal control, but not yet started on med prescribed at last visit (awaiting delivery from [place]). In meanwhile we will increase hctz and have her track pressures.  2. Anxiety Supply given and directed to start with 1 tablet and use if needed for anxiety. Would be ok to take 2 BID if needed while riding in car. Discussed new medication(s) today with patient. Discussed potential  side effects and patient verbalized understanding.  - LORazepam (ATIVAN) 0.5 MG tablet; Take 1 tablet (0.5 mg total) by mouth 2 (two) times daily as needed for anxiety.  Dispense: 30 tablet; Refill: 1  Return in about 2 months (around 07/11/2019) for Chronic condition visit.      Micheline Rough, MD

## 2019-05-11 ENCOUNTER — Ambulatory Visit (INDEPENDENT_AMBULATORY_CARE_PROVIDER_SITE_OTHER): Payer: Medicare Other | Admitting: Family Medicine

## 2019-05-11 ENCOUNTER — Encounter: Payer: Self-pay | Admitting: Family Medicine

## 2019-05-11 VITALS — BP 166/88 | HR 62 | Temp 97.7°F | Ht 66.0 in | Wt 166.3 lb

## 2019-05-11 DIAGNOSIS — I10 Essential (primary) hypertension: Secondary | ICD-10-CM | POA: Diagnosis not present

## 2019-05-11 DIAGNOSIS — F419 Anxiety disorder, unspecified: Secondary | ICD-10-CM

## 2019-05-11 MED ORDER — LORAZEPAM 0.5 MG PO TABS: 0.5000 mg | ORAL_TABLET | Freq: Two times a day (BID) | ORAL | 1 refills | Status: DC | PRN

## 2019-05-11 NOTE — Patient Instructions (Addendum)
Increase hydrochlorothiazide to 50mg  daily (2 of the 25mg  tablets). Keep taking other medications for blood pressure.   When you get the new combination medication from the [place], start tracking your pressures and please let me know after a couple of weeks if your pressures are still [id number] systolic or above. We will make a med adjustment prior to you returning to office next.

## 2019-08-04 ENCOUNTER — Other Ambulatory Visit: Payer: Self-pay | Admitting: Family Medicine

## 2019-08-08 NOTE — Telephone Encounter (Signed)
Wanted to make sure you saw message on previous rx request; looks like it was routed back to me but I didn't see response to my comment?

## 2019-08-08 NOTE — Telephone Encounter (Signed)
Please confirm current medications. We had put her on a combination of amlodipine-valsartan-hydrochlorothiazide for blood pressure. Perhaps refill request is coming in because of shortage of this medication? Uncertain if we need to fill individually or can just renew medications on her current med list.

## 2019-08-09 ENCOUNTER — Other Ambulatory Visit: Payer: Self-pay | Admitting: Family Medicine

## 2019-08-09 MED ORDER — HYDROCHLOROTHIAZIDE 50 MG PO TABS: 50.0000 mg | ORAL_TABLET | Freq: Every day | ORAL | 1 refills | Status: DC

## 2019-08-09 NOTE — Telephone Encounter (Signed)
Spoke with the pt and she stated she never received a call from the pharmacy regarding a combination pill and she did not call our office.  Stated she is currently taking HCTZ 25mg -2 tabs daily, Amlodipine 10mg  daily and Lisinopril 40mg  daily.  Message sent to PCP.

## 2019-08-10 NOTE — Telephone Encounter (Signed)
Spoke with the pt and informed her the Rxs below were sent to her pharmacy.

## 2019-11-28 ENCOUNTER — Other Ambulatory Visit: Payer: Self-pay | Admitting: Family Medicine

## 2019-11-28 ENCOUNTER — Telehealth: Payer: Self-pay

## 2019-11-28 DIAGNOSIS — F419 Anxiety disorder, unspecified: Secondary | ICD-10-CM

## 2019-11-28 MED ORDER — LORAZEPAM 0.5 MG PO TABS: 0.5000 mg | ORAL_TABLET | Freq: Two times a day (BID) | ORAL | 0 refills | Status: DC | PRN

## 2019-11-28 MED ORDER — TRIAMCINOLONE ACETONIDE 0.1 % EX CREA
1.0000 | TOPICAL_CREAM | Freq: Two times a day (BID) | CUTANEOUS | 1 refills | Status: DC
Start: 2019-11-28 — End: 2021-05-25

## 2019-11-28 MED ORDER — LISINOPRIL 40 MG PO TABS: 40.0000 mg | ORAL_TABLET | Freq: Every day | ORAL | 0 refills | Status: DC

## 2019-11-28 MED ORDER — AMLODIPINE BESYLATE 10 MG PO TABS: 10.0000 mg | ORAL_TABLET | Freq: Every day | ORAL | 0 refills | Status: DC

## 2019-11-28 MED ORDER — HYDROCHLOROTHIAZIDE 50 MG PO TABS: 50.0000 mg | ORAL_TABLET | Freq: Every day | ORAL | 0 refills | Status: DC

## 2019-11-28 MED ORDER — ATORVASTATIN CALCIUM 20 MG PO TABS: 20.0000 mg | ORAL_TABLET | Freq: Every day | ORAL | 0 refills | Status: DC

## 2019-11-28 NOTE — Telephone Encounter (Signed)
Rxs sent as below except for refill requests on Lorazepam and Triamcinolone cream were sent to PCP.

## 2019-11-28 NOTE — Telephone Encounter (Signed)
She is due for follow up visit. I will send refill, but please schedule.

## 2019-11-28 NOTE — Telephone Encounter (Signed)
Patient informed of the message below.  Appt scheduled for 12/29 to arrive at 2:15pm.

## 2019-11-28 NOTE — Telephone Encounter (Signed)
Patient called in stating that she needed a refill on all her medications   Vitamin D, Ergocalciferol, (DRISDOL) 1.25 MG (50000 UNIT) CAPS capsule  triamcinolone cream (KENALOG) 0.1 %  LORazepam (ATIVAN) 0.5 MG tablet  lisinopril (ZESTRIL) 40 MG tablet  hydrochlorothiazide (HYDRODIURIL) 50 MG tablet  atorvastatin (LIPITOR) 20 MG tablet  amLODipine (NORVASC) 10 MG tablet   [id number] - [place], [place] - [address]

## 2019-12-18 ENCOUNTER — Telehealth: Payer: Self-pay | Admitting: Family Medicine

## 2019-12-18 NOTE — Telephone Encounter (Signed)
Left message for patient to call back and schedule Medicare Annual Wellness Visit (AWV) either virtually or in office.   Last AWV 03/29/2017 please schedule at anytime with [facility] Nurse Health Advisor 1 or 2   This should be a 45 minute visit.

## 2020-01-16 ENCOUNTER — Encounter: Payer: Self-pay | Admitting: Family Medicine

## 2020-01-16 ENCOUNTER — Ambulatory Visit (INDEPENDENT_AMBULATORY_CARE_PROVIDER_SITE_OTHER): Payer: Medicare Other | Admitting: Family Medicine

## 2020-01-16 ENCOUNTER — Other Ambulatory Visit: Payer: Self-pay

## 2020-01-16 VITALS — BP 130/80 | HR 87 | Temp 98.8°F | Ht 66.0 in | Wt 169.3 lb

## 2020-01-16 DIAGNOSIS — R739 Hyperglycemia, unspecified: Secondary | ICD-10-CM | POA: Diagnosis not present

## 2020-01-16 DIAGNOSIS — E559 Vitamin D deficiency, unspecified: Secondary | ICD-10-CM

## 2020-01-16 DIAGNOSIS — E785 Hyperlipidemia, unspecified: Secondary | ICD-10-CM

## 2020-01-16 DIAGNOSIS — K219 Gastro-esophageal reflux disease without esophagitis: Secondary | ICD-10-CM

## 2020-01-16 DIAGNOSIS — I10 Essential (primary) hypertension: Secondary | ICD-10-CM | POA: Diagnosis not present

## 2020-01-16 NOTE — Patient Instructions (Signed)
Check expiration date on cologuard and let me know if a new one is needed.

## 2020-01-16 NOTE — Progress Notes (Signed)
Alexandra Flores DOB: 30-Aug-1945 Encounter date: 01/16/2020  This is a 74 y.o. female who presents with Chief Complaint  Patient presents with   Follow-up    History of present illness: Last visit with me was 05/11/19: Has a roaring in her ear, just when she lays down at night in both ears; but just if sinuses are bothering her. Not clogged or pressure. Nothing during day. Just if sinuses are bothering her.   Hypertension: Amlodipine 10 mg, hydrochlorothiazide 50 mg - does check at home and gets normal numbers.  Tobacco QIW:LNLGXQJ about 1 pack per day. No interest in quitting. Hyperlipidemia: Lipitor 20 mg daily; no aches or pains with this.  GERD: Over-the-counter medications just as needed.  Did not complete Cologuard kit - just hasn't thought about.  Gynecologist: hasn't seen one in years. Last time was she thinks 2017; but at least was seen in 2014; was told only prn follow up per patient. This was after hpv+ pap.  Not interested in lung cancer screening. Feels she has made it this long without anything wrong; worries she will give up if she gets dx of cancer.   Doesn't want shingrix vaccine. States will get COVID booster.   Declines bone density.   No Known Allergies Current Meds  Medication Sig   amLODipine (NORVASC) 10 MG tablet Take 1 tablet (10 mg total) by mouth daily.   atorvastatin (LIPITOR) 20 MG tablet Take 1 tablet (20 mg total) by mouth daily.   hydrochlorothiazide (HYDRODIURIL) 50 MG tablet Take 1 tablet (50 mg total) by mouth daily.   lisinopril (ZESTRIL) 40 MG tablet Take 1 tablet (40 mg total) by mouth daily.   LORazepam (ATIVAN) 0.5 MG tablet Take 1 tablet (0.5 mg total) by mouth 2 (two) times daily as needed for anxiety.   Multiple Vitamins-Minerals (MULTIVITAMIN ADULTS PO) Take by mouth.   triamcinolone cream (KENALOG) 0.1 % Apply 1 application topically 2 (two) times daily.   [DISCONTINUED] Vitamin D, Ergocalciferol, (DRISDOL) 1.25 MG (50000  UNIT) CAPS capsule Take 1 capsule (50,000 Units total) by mouth every 7 (seven) days.    Review of Systems  Constitutional: Negative for activity change, appetite change, chills, fatigue, fever and unexpected weight change.  HENT: Negative for congestion, ear pain (no pain; some roaring when sinus congestion present), hearing loss, sinus pressure, sinus pain, sore throat and trouble swallowing.   Eyes: Negative for pain and visual disturbance.  Respiratory: Negative for cough, chest tightness, shortness of breath and wheezing.   Cardiovascular: Negative for chest pain, palpitations and leg swelling.  Gastrointestinal: Negative for abdominal pain, blood in stool, constipation, diarrhea, nausea and vomiting.  Genitourinary: Negative for difficulty urinating and menstrual problem.  Musculoskeletal: Negative for arthralgias and back pain.  Skin: Negative for rash.  Neurological: Negative for dizziness, weakness, numbness and headaches.  Hematological: Negative for adenopathy. Does not bruise/bleed easily.  Psychiatric/Behavioral: Negative for sleep disturbance and suicidal ideas. The patient is not nervous/anxious.     Objective:  BP 130/80 (BP Location: Left Arm, Patient Position: Sitting, Cuff Size: Large)    Pulse 87    Temp 98.8 F (37.1 C) (Oral)    Ht _0  (1.676 m)    Wt 169 lb 4.8 oz (76.8 kg)    BMI 27.33 kg/m   Weight: 169 lb 4.8 oz (76.8 kg)   BP Readings from Last 3 Encounters:  01/16/20 130/80  05/11/19 (!) 166/88  03/21/19 140/76   Wt Readings from Last 3 Encounters:  01/16/20  169 lb 4.8 oz (76.8 kg)  05/11/19 166 lb 4.8 oz (75.4 kg)  03/21/19 164 lb (74.4 kg)    Physical Exam Constitutional:      General: She is not in acute distress.    Appearance: She is well-developed and well-nourished.  HENT:     Head: Normocephalic and atraumatic.     Right Ear: Tympanic membrane, ear canal and external ear normal.     Left Ear: Tympanic membrane, ear canal and external ear  normal.     Mouth/Throat:     Mouth: Oropharynx is clear and moist.     Pharynx: No oropharyngeal exudate.     Comments: dentures Eyes:     Conjunctiva/sclera: Conjunctivae normal.     Pupils: Pupils are equal, round, and reactive to light.  Neck:     Thyroid: No thyromegaly.  Cardiovascular:     Rate and Rhythm: Normal rate and regular rhythm.     Heart sounds: Normal heart sounds. No murmur heard. No friction rub. No gallop.   Pulmonary:     Effort: Pulmonary effort is normal.     Breath sounds: Normal breath sounds.  Abdominal:     General: Bowel sounds are normal. There is no distension.     Palpations: Abdomen is soft. There is no mass.     Tenderness: There is no abdominal tenderness. There is no guarding.     Hernia: No hernia is present.  Musculoskeletal:        General: No tenderness, deformity or edema. Normal range of motion.     Cervical back: Normal range of motion and neck supple.  Lymphadenopathy:     Cervical: No cervical adenopathy.  Skin:    General: Skin is warm and dry.     Findings: No rash.  Neurological:     Mental Status: She is alert and oriented to person, place, and time.     Deep Tendon Reflexes: Strength normal. Reflexes normal.     Reflex Scores:      Tricep reflexes are 2+ on the right side and 2+ on the left side.      Bicep reflexes are 2+ on the right side and 2+ on the left side.      Brachioradialis reflexes are 2+ on the right side and 2+ on the left side.      Patellar reflexes are 2+ on the right side and 2+ on the left side. Psychiatric:        Mood and Affect: Mood and affect normal.        Speech: Speech normal.        Behavior: Behavior normal.        Thought Content: Thought content normal.     Assessment/Plan She declines multiple preventative health measures, which we discussed in detail today.  Mammogram: she declines this as well as in office breast exam. I encouraged her to consider if she would think about treatment if  she noted abnormality like cancer; and if so, the really working towards preventative/screenings is important as this is our chance to catch cancers early on when treatment options are better/easier.  Cologuard: she states that she will check expiration date and let me know if new order is needed. She does not want colonoscopy.  Lung cancer screening: she declines this. Again we discussed benefit of finding cancer early, but she prefers not to proceed with screening. She knows she can reach out at any time if she changes her mind. She also  does not want to stop smoking. She is not interested in any help with this.  She declines flu vaccine. She declines shingrix vaccine. We have discussed benefits of both of these.  She declines bone density screening.   1. Essential hypertension Blood pressure is well controlled. Continue amlodipine 9m daily, lisinopril 457mdaily. - Comprehensive metabolic panel; Future - Comprehensive metabolic panel  2. Gastroesophageal reflux disease, unspecified whether esophagitis present Diet controlled.  3. Vitamin D deficiency She is no longer taking vitamin d supplement.  - VITAMIN D 25 Hydroxy (Vit-D Deficiency, Fractures); Future - VITAMIN D 25 Hydroxy (Vit-D Deficiency, Fractures)  4. Hyperlipidemia, unspecified hyperlipidemia type She does well with lipitor 2070maily. Recheck labs today. - Lipid panel; Future - Lipid panel  5. Hyperglycemia Will recheck bloodwork. She eats in moderation but is not following specific diet.  - Hemoglobin A1c; Future - Hemoglobin A1c  Return in about 6 months (around 07/16/2020) for Chronic condition visit.   Patient declines skin exam   JunMicheline RoughD

## 2020-01-17 LAB — COMPREHENSIVE METABOLIC PANEL
ALT: 14 U/L (ref 0–35)
AST: 14 U/L (ref 0–37)
Albumin: 4.6 g/dL (ref 3.5–5.2)
Alkaline Phosphatase: 70 U/L (ref 39–117)
BUN: 12 mg/dL (ref 6–23)
CO2: 31 mEq/L (ref 19–32)
Calcium: 10.1 mg/dL (ref 8.4–10.5)
Chloride: 102 mEq/L (ref 96–112)
Creatinine, Ser: 0.7 mg/dL (ref 0.40–1.20)
GFR: 86.54 mL/min (ref >60.00)
Glucose, Bld: 92 mg/dL (ref 70–99)
Potassium: 3.6 mEq/L (ref 3.5–5.1)
Sodium: 139 mEq/L (ref 135–145)
Total Bilirubin: 0.5 mg/dL (ref 0.2–1.2)
Total Protein: 7.7 g/dL (ref 6.0–8.3)

## 2020-01-17 LAB — LIPID PANEL
Cholesterol: 148 mg/dL (ref 0–200)
HDL: 32.5 mg/dL — ABNORMAL LOW (ref >39.00)
LDL Cholesterol: 92 mg/dL (ref 0–99)
NonHDL: 115.36
Total CHOL/HDL Ratio: 5
Triglycerides: 115 mg/dL (ref 0.0–149.0)
VLDL: 23 mg/dL (ref 0.0–40.0)

## 2020-01-17 LAB — HEMOGLOBIN A1C: Hgb A1c MFr Bld: 6.2 % (ref 4.6–6.5)

## 2020-01-17 LAB — VITAMIN D 25 HYDROXY (VIT D DEFICIENCY, FRACTURES): VITD: 28.54 ng/mL — ABNORMAL LOW (ref 30.00–100.00)

## 2020-02-29 ENCOUNTER — Telehealth: Payer: Self-pay | Admitting: Family Medicine

## 2020-02-29 NOTE — Telephone Encounter (Signed)
Tried calling patient to  schedule Medicare Annual Wellness Visit (AWV) either virtually or in office.  No answer  Last AWV 03/29/17  please schedule at anytime with [facility] Nurse Health Advisor 1 or 2   This should be a 45 minute visit.

## 2020-05-16 ENCOUNTER — Other Ambulatory Visit: Payer: Self-pay | Admitting: Family Medicine

## 2020-05-21 ENCOUNTER — Ambulatory Visit (INDEPENDENT_AMBULATORY_CARE_PROVIDER_SITE_OTHER): Payer: Medicare Other

## 2020-05-21 ENCOUNTER — Other Ambulatory Visit: Payer: Self-pay

## 2020-05-21 DIAGNOSIS — Z0000 Encounter for general adult medical examination without abnormal findings: Secondary | ICD-10-CM | POA: Diagnosis not present

## 2020-05-21 NOTE — Patient Instructions (Addendum)
Alexandra Flores , Thank you for taking time to come for your Medicare Wellness Visit. I appreciate your ongoing commitment to your health goals. Please review the following plan we discussed and let me know if I can assist you in the future.   Screening recommendations/referrals: Colonoscopy: Done 09/27/13 Mammogram: Done 04/26/12 Bone Density: Declined  Recommended yearly ophthalmology/optometry visit for glaucoma screening and checkup Recommended yearly dental visit for hygiene and checkup  Vaccinations: Influenza vaccine: Declined Pneumococcal vaccine: Declined Tdap vaccine: Up to date Shingles vaccine: Shingrix discussed. Please contact your pharmacy for coverage information.    Covid-19:Completed 2/25 & 04/10/19  Advanced directives: Please bring a copy of your health care power of attorney and living will to the office at your convenience.  Conditions/risks identified: lose weight in stomach  Next appointment: Follow up in one year for your annual wellness visit    Preventive Care 65 Years and Older, Female Preventive care refers to lifestyle choices and visits with your health care provider that can promote health and wellness. What does preventive care include?  A yearly physical exam. This is also called an annual well check.  Dental exams once or twice a year.  Routine eye exams. Ask your health care provider how often you should have your eyes checked.  Personal lifestyle choices, including:  Daily care of your teeth and gums.  Regular physical activity.  Eating a healthy diet.  Avoiding tobacco and drug use.  Limiting alcohol use.  Practicing safe sex.  Taking low-dose aspirin every day.  Taking vitamin and mineral supplements as recommended by your health care provider. What happens during an annual well check? The services and screenings done by your health care provider during your annual well check will depend on your age, overall health, lifestyle risk  factors, and family history of disease. Counseling  Your health care provider may ask you questions about your:  Alcohol use.  Tobacco use.  Drug use.  Emotional well-being.  Home and relationship well-being.  Sexual activity.  Eating habits.  History of falls.  Memory and ability to understand (cognition).  Work and work Statistician.  Reproductive health. Screening  You may have the following tests or measurements:  Height, weight, and BMI.  Blood pressure.  Lipid and cholesterol levels. These may be checked every 5 years, or more frequently if you are over 63 years old.  Skin check.  Lung cancer screening. You may have this screening every year starting at age 73 if you have a 30-pack-year history of smoking and currently smoke or have quit within the past 15 years.  Fecal occult blood test (FOBT) of the stool. You may have this test every year starting at age 78.  Flexible sigmoidoscopy or colonoscopy. You may have a sigmoidoscopy every 5 years or a colonoscopy every 10 years starting at age 8.  Hepatitis C blood test.  Hepatitis B blood test.  Sexually transmitted disease (STD) testing.  Diabetes screening. This is done by checking your blood sugar (glucose) after you have not eaten for a while (fasting). You may have this done every 1-3 years.  Bone density scan. This is done to screen for osteoporosis. You may have this done starting at age 73.  Mammogram. This may be done every 1-2 years. Talk to your health care provider about how often you should have regular mammograms. Talk with your health care provider about your test results, treatment options, and if necessary, the need for more tests. Vaccines  Your health care  provider may recommend certain vaccines, such as:  Influenza vaccine. This is recommended every year.  Tetanus, diphtheria, and acellular pertussis (Tdap, Td) vaccine. You may need a Td booster every 10 years.  Zoster vaccine. You  may need this after age 6.  Pneumococcal 13-valent conjugate (PCV13) vaccine. One dose is recommended after age 63.  Pneumococcal polysaccharide (PPSV23) vaccine. One dose is recommended after age 71. Talk to your health care provider about which screenings and vaccines you need and how often you need them. This information is not intended to replace advice given to you by your health care provider. Make sure you discuss any questions you have with your health care provider. Document Released: 01/31/2015 Document Revised: 09/24/2015 Document Reviewed: 11/05/2014 Elsevier Interactive Patient Education  2017 [employer] Prevention in the Home Falls can cause injuries. They can happen to people of all ages. There are many things you can do to make your home safe and to help prevent falls. What can I do on the outside of my home?  Regularly fix the edges of walkways and driveways and fix any cracks.  Remove anything that might make you trip as you walk through a door, such as a raised step or threshold.  Trim any bushes or trees on the path to your home.  Use bright outdoor lighting.  Clear any walking paths of anything that might make someone trip, such as rocks or tools.  Regularly check to see if handrails are loose or broken. Make sure that both sides of any steps have handrails.  Any raised decks and porches should have guardrails on the edges.  Have any leaves, snow, or ice cleared regularly.  Use sand or salt on walking paths during winter.  Clean up any spills in your garage right away. This includes oil or grease spills. What can I do in the bathroom?  Use night lights.  Install grab bars by the toilet and in the tub and shower. Do not use towel bars as grab bars.  Use non-skid mats or decals in the tub or shower.  If you need to sit down in the shower, use a plastic, non-slip stool.  Keep the floor dry. Clean up any water that spills on the floor as soon as  it happens.  Remove soap buildup in the tub or shower regularly.  Attach bath mats securely with double-sided non-slip rug tape.  Do not have throw rugs and other things on the floor that can make you trip. What can I do in the bedroom?  Use night lights.  Make sure that you have a light by your bed that is easy to reach.  Do not use any sheets or blankets that are too big for your bed. They should not hang down onto the floor.  Have a firm chair that has side arms. You can use this for support while you get dressed.  Do not have throw rugs and other things on the floor that can make you trip. What can I do in the kitchen?  Clean up any spills right away.  Avoid walking on wet floors.  Keep items that you use a lot in easy-to-reach places.  If you need to reach something above you, use a strong step stool that has a grab bar.  Keep electrical cords out of the way.  Do not use floor polish or wax that makes floors slippery. If you must use wax, use non-skid floor wax.  Do not have throw  rugs and other things on the floor that can make you trip. What can I do with my stairs?  Do not leave any items on the stairs.  Make sure that there are handrails on both sides of the stairs and use them. Fix handrails that are broken or loose. Make sure that handrails are as long as the stairways.  Check any carpeting to make sure that it is firmly attached to the stairs. Fix any carpet that is loose or worn.  Avoid having throw rugs at the top or bottom of the stairs. If you do have throw rugs, attach them to the floor with carpet tape.  Make sure that you have a light switch at the top of the stairs and the bottom of the stairs. If you do not have them, ask someone to add them for you. What else can I do to help prevent falls?  Wear shoes that:  Do not have high heels.  Have rubber bottoms.  Are comfortable and fit you well.  Are closed at the toe. Do not wear sandals.  If  you use a stepladder:  Make sure that it is fully opened. Do not climb a closed stepladder.  Make sure that both sides of the stepladder are locked into place.  Ask someone to hold it for you, if possible.  Clearly mark and make sure that you can see:  Any grab bars or handrails.  First and last steps.  Where the edge of each step is.  Use tools that help you move around (mobility aids) if they are needed. These include:  Canes.  Walkers.  Scooters.  Crutches.  Turn on the lights when you go into a dark area. Replace any light bulbs as soon as they burn out.  Set up your furniture so you have a clear path. Avoid moving your furniture around.  If any of your floors are uneven, fix them.  If there are any pets around you, be aware of where they are.  Review your medicines with your doctor. Some medicines can make you feel dizzy. This can increase your chance of falling. Ask your doctor what other things that you can do to help prevent falls. This information is not intended to replace advice given to you by your health care provider. Make sure you discuss any questions you have with your health care provider. Document Released: 10/31/2008 Document Revised: 06/12/2015 Document Reviewed: 02/08/2014 Elsevier Interactive Patient Education  2017 [employer].

## 2020-05-21 NOTE — Progress Notes (Addendum)
Virtual Visit via Telephone Note  I connected with  Servando Snare on 05/21/20 at  1:45 PM EDT by telephone and verified that I am speaking with the correct person using two identifiers.  Location: Patient: Home Provider: Office Persons participating in the virtual visit: patient/Nurse Health Advisor   I discussed the limitations, risks, security and privacy concerns of performing an evaluation and management service by telephone and the availability of in person appointments. The patient expressed understanding and agreed to proceed.  Interactive audio and video telecommunications were attempted between this nurse and patient, however failed, due to patient having technical difficulties OR patient did not have access to video capability.  We continued and completed visit with audio only.  Some vital signs may be absent or patient reported.   Willette Brace, LPN  .   Subjective:   TERSEA AULDS is a 75 y.o. female who presents for Medicare Annual (Subsequent) preventive examination.  Review of Systems     Cardiac Risk Factors include: hypertension;dyslipidemia;advanced age (>23men, >22 women);smoking/ tobacco exposure     Objective:    There were no vitals filed for this visit. There is no height or weight on file to calculate BMI.  Advanced Directives 05/21/2020 09/27/2013  Does Patient Have a Medical Advance Directive? Yes Yes  Type of Paramedic of [place];Living will  Copy of [place] in Chart? No - copy requested No - copy requested    Current Medications (verified) Outpatient Encounter Medications as of 05/21/2020  Medication Sig  . amLODipine (NORVASC) 10 MG tablet TAKE 1 TABLET BY MOUTH EVERY DAY  . atorvastatin (LIPITOR) 20 MG tablet TAKE 1 TABLET BY MOUTH EVERY DAY  . hydrochlorothiazide (HYDRODIURIL) 50 MG tablet Take 1 tablet (50 mg total) by mouth daily.  Marland Kitchen lisinopril (ZESTRIL) 40 MG  tablet *NEED OFFICE VISIT* TAKE 1 TABLET BY MOUTH EVERY DAY  . LORazepam (ATIVAN) 0.5 MG tablet Take 1 tablet (0.5 mg total) by mouth 2 (two) times daily as needed for anxiety.  . Multiple Vitamins-Minerals (MULTIVITAMIN ADULTS PO) Take by mouth.  . triamcinolone cream (KENALOG) 0.1 % Apply 1 application topically 2 (two) times daily.   No facility-administered encounter medications on file as of 05/21/2020.    Allergies (verified) Patient has no known allergies.   History: Past Medical History:  Diagnosis Date  . GERD (gastroesophageal reflux disease)   . Hyperlipemia 09/30/2015  . Hypertension   . Tobacco use 02/10/2012   Past Surgical History:  Procedure Laterality Date  . BREAST SURGERY     benign cyst removal   Family History  Problem Relation Age of Onset  . Stroke Mother 36  . Hypertension Mother   . Cancer Sister 21       'stomach cancer'   . Arthritis Father   . Eczema Father   . HIV/AIDS Brother   . Asthma Sister    Social History   Socioeconomic History  . Marital status: Married    Spouse name: Not on file  . Number of children: Not on file  . Years of education: Not on file  . Highest education level: Not on file  Occupational History  . Not on file  Tobacco Use  . Smoking status: Current Every Day Smoker    Packs/day: 1.00    Years: 48.00    Pack years: 48.00    Types: Cigarettes  . Smokeless tobacco: Never Used  . Tobacco comment: per  patient not even a pack a day; takes 3 puffs and puts it out   Substance and Sexual Activity  . Alcohol use: Yes    Alcohol/week: 0.0 standard drinks    Comment: occ   . Drug use: Not on file  . Sexual activity: Not on file  Other Topics Concern  . Not on file  Social History Narrative   Work or School: none, retired, worked at [employer] for 25 years       Home Situation: lives with husband; mother passed in 2017      Spiritual Beliefs: methodist, christian      Lifestyle: does bowling every Wednesday, no  regular exercise otherwise, diet is not great - husband is active and exercises         Social Determinants of Radio broadcast assistant Strain: Not on file  Food Insecurity: Not on file  Transportation Needs: Not on file  Physical Activity: Insufficiently Active  . Days of Exercise per Week: 1 day  . Minutes of Exercise per Session: 30 min  Stress: Not on file  Social Connections: Not on file    Tobacco Counseling Ready to quit: Not Answered Counseling given: Not Answered Comment: per patient not even a pack a day; takes 3 puffs and puts it out    Clinical Intake:  Pre-visit preparation completed: Yes  Pain : No/denies pain     BMI - recorded: 27.34 Nutritional Status: BMI 25 -29 Overweight Nutritional Risks: None Diabetes: No  How often do you need to have someone help you when you read instructions, pamphlets, or other written materials from your doctor or pharmacy?: 1 - Never  Diabetic?No  Interpreter Needed?: No  Information entered by :: Charlott Rakes, LPN   Activities of Daily Living In your present state of health, do you have any difficulty performing the following activities: 05/21/2020  Hearing? N  Vision? N  Difficulty concentrating or making decisions? N  Walking or climbing stairs? N  Dressing or bathing? N  Doing errands, shopping? N  Preparing Food and eating ? N  Using the Toilet? N  In the past six months, have you accidently leaked urine? N  Do you have problems with loss of bowel control? N  Managing your Medications? N  Managing your Finances? N  Housekeeping or managing your Housekeeping? N  Some recent data might be hidden    Patient Care Team: Caren Macadam, MD as PCP - General (Family Medicine) Azucena Fallen, MD as Consulting Physician (Obstetrics and Gynecology)  Indicate any recent Medical Services you may have received from other than [facility] providers in the past year (date may be approximate).     Assessment:    This is a routine wellness examination for [place].  Hearing/Vision screen  Hearing Screening   125Hz  250Hz  500Hz  1000Hz  2000Hz  3000Hz  4000Hz  6000Hz  8000Hz   Right ear:           Left ear:           Comments: Pt denies any hearing issues   Vision Screening Comments: Pt follows up with new provider will call with the name  Dietary issues and exercise activities discussed: Current Exercise Habits: The patient does not participate in regular exercise at present  Goals Addressed            This Visit's Progress   . Patient Stated       Lose weight in stomach       Depression Screen PHQ 2/9 Scores  03/21/2019 03/29/2017 09/30/2015 07/04/2015 09/27/2013  PHQ - 2 Score 0 0 0 0 0    Fall Risk Fall Risk  05/21/2020 03/21/2019 03/29/2017 09/30/2015 07/04/2015  Falls in the past year? 0 0 No No No  Number falls in past yr: 0 0 - - -  Injury with Fall? 0 0 - - -  Risk for fall due to : Impaired vision - - - -  Follow up Falls prevention discussed - - - -    FALL RISK PREVENTION PERTAINING TO THE HOME:  Any stairs in or around the home? Yes  If so, are there any without handrails? No  Home free of loose throw rugs in walkways, pet beds, electrical cords, etc? Yes  Adequate lighting in your home to reduce risk of falls? Yes   ASSISTIVE DEVICES UTILIZED TO PREVENT FALLS:  Life alert? No  Use of a cane, walker or w/c? No  Grab bars in the bathroom? Yes  Shower chair or bench in shower? No  Elevated toilet seat or a handicapped toilet? No   TIMED UP AND GO:  Was the test performed? No     Cognitive Function:     6CIT Screen 05/21/2020  What Year? 0 points  What month? 0 points  Count back from 20 0 points  Months in reverse 0 points  Repeat phrase 2 points    Immunizations Immunization History  Administered Date(s) Administered  . PFIZER(Purple Top)SARS-COV-2 Vaccination 03/15/2019, 04/10/2019  . Tdap 02/24/2012    TDAP status: Up to date  Flu Vaccine status: Declined,  Education has been provided regarding the importance of this vaccine but patient still declined. Advised may receive this vaccine at local pharmacy or Health Dept. Aware to provide a copy of the vaccination record if obtained from local pharmacy or Health Dept. Verbalized acceptance and understanding.  Pneumococcal vaccine status: Declined,  Education has been provided regarding the importance of this vaccine but patient still declined. Advised may receive this vaccine at local pharmacy or Health Dept. Aware to provide a copy of the vaccination record if obtained from local pharmacy or Health Dept. Verbalized acceptance and understanding.   Covid-19 vaccine status: Completed vaccines  Qualifies for Shingles Vaccine? Yes   Zostavax completed No   Shingrix Completed?: No.    Education has been provided regarding the importance of this vaccine. Patient has been advised to call insurance company to determine out of pocket expense if they have not yet received this vaccine. Advised may also receive vaccine at local pharmacy or Health Dept. Verbalized acceptance and understanding.  Screening Tests Health Maintenance  Topic Date Due  . COVID-19 Vaccine (3 - Booster for Pfizer series) 10/11/2019  . MAMMOGRAM  05/21/2021 (Originally 04/27/2014)  . DEXA SCAN  05/21/2021 (Originally 11/05/2012)  . PNA vac Low Risk Adult (1 of 2 - PCV13) 03/31/2024 (Originally 11/05/2012)  . INFLUENZA VACCINE  08/18/2020  . TETANUS/TDAP  02/23/2022  . COLONOSCOPY (Pts 45-17yrs Insurance coverage will need to be confirmed)  09/28/2023  . Hepatitis C Screening  Completed  . HPV VACCINES  Aged Out    Health Maintenance  Health Maintenance Due  Topic Date Due  . COVID-19 Vaccine (3 - Booster for Pfizer series) 10/11/2019    Colorectal cancer screening: Type of screening: Colonoscopy. Completed 09/27/13. Repeat every 10 years  Mammogram status: Completed 04/26/12. Repeat every year     Additional  Screening:  Hepatitis C Screening:  Completed 09/30/15  Vision Screening: Recommended annual ophthalmology exams for  early detection of glaucoma and other disorders of the eye. Is the patient up to date with their annual eye exam?  Yes  Who is the provider or what is the name of the office in which the patient attends annual eye exams? Pt will call with providers  If pt is not established with a provider, would they like to be referred to a provider to establish care? No .   Dental Screening: Recommended annual dental exams for proper oral hygiene  Community Resource Referral / Chronic Care Management: CRR required this visit?  No   CCM required this visit?  No      Plan:     I have personally reviewed and noted the following in the patient's chart:   . Medical and social history . Use of alcohol, tobacco or illicit drugs  . Current medications and supplements including opioid prescriptions.  . Functional ability and status . Nutritional status . Physical activity . Advanced directives . List of other physicians . Hospitalizations, surgeries, and ER visits in previous 12 months . Vitals . Screenings to include cognitive, depression, and falls . Referrals and appointments  In addition, I have reviewed and discussed with patient certain preventive protocols, quality metrics, and best practice recommendations. A written personalized care plan for preventive services as well as general preventive health recommendations were provided to patient.     Willette Brace, LPN   09/24/4161   Nurse Notes: None

## 2020-06-20 DIAGNOSIS — H5213 Myopia, bilateral: Secondary | ICD-10-CM | POA: Diagnosis not present

## 2020-06-20 DIAGNOSIS — H52223 Regular astigmatism, bilateral: Secondary | ICD-10-CM | POA: Diagnosis not present

## 2020-06-20 DIAGNOSIS — Z135 Encounter for screening for eye and ear disorders: Secondary | ICD-10-CM | POA: Diagnosis not present

## 2020-06-20 DIAGNOSIS — H2513 Age-related nuclear cataract, bilateral: Secondary | ICD-10-CM | POA: Diagnosis not present

## 2020-06-20 DIAGNOSIS — H524 Presbyopia: Secondary | ICD-10-CM | POA: Diagnosis not present

## 2020-08-16 ENCOUNTER — Other Ambulatory Visit: Payer: Self-pay | Admitting: Family Medicine

## 2020-09-01 DIAGNOSIS — H2513 Age-related nuclear cataract, bilateral: Secondary | ICD-10-CM | POA: Diagnosis not present

## 2020-09-01 DIAGNOSIS — H2511 Age-related nuclear cataract, right eye: Secondary | ICD-10-CM | POA: Diagnosis not present

## 2020-09-01 DIAGNOSIS — H40013 Open angle with borderline findings, low risk, bilateral: Secondary | ICD-10-CM | POA: Diagnosis not present

## 2020-09-25 DIAGNOSIS — H25811 Combined forms of age-related cataract, right eye: Secondary | ICD-10-CM | POA: Diagnosis not present

## 2020-09-25 DIAGNOSIS — H2511 Age-related nuclear cataract, right eye: Secondary | ICD-10-CM | POA: Diagnosis not present

## 2020-09-26 DIAGNOSIS — H2512 Age-related nuclear cataract, left eye: Secondary | ICD-10-CM | POA: Diagnosis not present

## 2020-10-06 ENCOUNTER — Encounter: Payer: Self-pay | Admitting: Family Medicine

## 2020-10-06 ENCOUNTER — Telehealth (INDEPENDENT_AMBULATORY_CARE_PROVIDER_SITE_OTHER): Payer: Medicare Other | Admitting: Family Medicine

## 2020-10-06 ENCOUNTER — Telehealth: Payer: Self-pay | Admitting: Family Medicine

## 2020-10-06 DIAGNOSIS — R197 Diarrhea, unspecified: Secondary | ICD-10-CM | POA: Diagnosis not present

## 2020-10-06 NOTE — Telephone Encounter (Signed)
Patient is requesting a call back from Wendie Simmer. She stated that she thinks she have a virus or bug. She has been experiencing diarrhea and vomiting. Patient tested negative for covid.  Patient could be contacted at [phone].  Please advise.

## 2020-10-06 NOTE — Progress Notes (Signed)
Patient ID: Alexandra Flores, female   DOB: 05-17-1945, 75 y.o.   MRN: [id number]   This visit type was conducted due to national recommendations for restrictions regarding the COVID-19 pandemic in an effort to limit this patient's exposure and mitigate transmission in our community.   Virtual Visit via Video Note  I connected with [name] on 10/06/20 at 11:15 AM EDT by a video enabled telemedicine application and verified that I am speaking with the correct person using two identifiers.  Location patient: home Location provider:work or home office Persons participating in the virtual visit: patient, provider  I discussed the limitations of evaluation and management by telemedicine and the availability of in person appointments. The patient expressed understanding and agreed to proceed.   HPI:  Alexandra Flores developed some diarrhea last Thursday night.  She has been going about 2 times per day since then.  She had eaten at [employer] that night and wondered if this may have been related to something she had eaten.  However, she has had no fever and no bloody stools.  She had 1 episode of vomiting Friday but none since then.  No fever.  Home COVID test negative.  No recent antibiotics.  She had recent cataract surgery and is scheduled to get repeat cataract surgery this Thursday.  Is keeping down fluids well.  No abdominal pain.   ROS: See pertinent positives and negatives per HPI.  Past Medical History:  Diagnosis Date   GERD (gastroesophageal reflux disease)    Hyperlipemia 09/30/2015   Hypertension    Tobacco use 02/10/2012    Past Surgical History:  Procedure Laterality Date   BREAST SURGERY     benign cyst removal    Family History  Problem Relation Age of Onset   Stroke Mother 12   Hypertension Mother    Cancer Sister 72       'stomach cancer'    Arthritis Father    Eczema Father    HIV/AIDS Brother    Asthma Sister     SOCIAL HX: Long history of  smoking   Current Outpatient Medications:    amLODipine (NORVASC) 10 MG tablet, TAKE 1 TABLET BY MOUTH EVERY DAY, Disp: 90 tablet, Rfl: 0   atorvastatin (LIPITOR) 20 MG tablet, TAKE 1 TABLET BY MOUTH EVERY DAY, Disp: 90 tablet, Rfl: 0   hydrochlorothiazide (HYDRODIURIL) 50 MG tablet, TAKE 1 TABLET BY MOUTH EVERY DAY, Disp: 90 tablet, Rfl: 0   lisinopril (ZESTRIL) 40 MG tablet, *NEED OFFICE VISIT* TAKE 1 TABLET BY MOUTH EVERY DAY, Disp: 90 tablet, Rfl: 0   LORazepam (ATIVAN) 0.5 MG tablet, Take 1 tablet (0.5 mg total) by mouth 2 (two) times daily as needed for anxiety., Disp: 30 tablet, Rfl: 0   Multiple Vitamins-Minerals (MULTIVITAMIN ADULTS PO), Take by mouth., Disp: , Rfl:    triamcinolone cream (KENALOG) 0.1 %, Apply 1 application topically 2 (two) times daily., Disp: 45 g, Rfl: 1  EXAM:  VITALS per patient if applicable:  GENERAL: alert, oriented, appears well and in no acute distress  HEENT: atraumatic, conjunttiva clear, no obvious abnormalities on inspection of external nose and ears  NECK: normal movements of the head and neck  LUNGS: on inspection no signs of respiratory distress, breathing rate appears normal, no obvious gross SOB, gasping or wheezing  CV: no obvious cyanosis  MS: moves all visible extremities without noticeable abnormality  PSYCH/NEURO: pleasant and cooperative, no obvious depression or anxiety, speech and thought processing grossly intact  ASSESSMENT  AND PLAN:  Discussed the following assessment and plan:   Diarrhea symptoms.  Suspect probably viral.  She is not have any red flags such as recurrent vomiting, fever, bloody stools, etc.  -Recommend bland diet and this was discussed in some detail -Plenty of fluids -Would not recommend labs at this point but follow-up immediately for any fever, worsening diarrhea, bloody stools, or recurrent vomiting.  Also be in touch if diarrhea not resolving over the next 8 to 10 days.    I discussed the  assessment and treatment plan with the patient. The patient was provided an opportunity to ask questions and all were answered. The patient agreed with the plan and demonstrated an understanding of the instructions.   The patient was advised to call back or seek an in-person evaluation if the symptoms worsen or if the condition fails to improve as anticipated.     Carolann Littler, MD

## 2020-10-06 NOTE — Telephone Encounter (Signed)
Spoke with the patient and scheduled a virtual visit for today with Dr Elease Hashimoto at 11:15am.

## 2020-10-09 DIAGNOSIS — H25812 Combined forms of age-related cataract, left eye: Secondary | ICD-10-CM | POA: Diagnosis not present

## 2020-10-09 DIAGNOSIS — H2512 Age-related nuclear cataract, left eye: Secondary | ICD-10-CM | POA: Diagnosis not present

## 2020-11-08 ENCOUNTER — Other Ambulatory Visit: Payer: Self-pay

## 2020-11-08 ENCOUNTER — Emergency Department (HOSPITAL_COMMUNITY): Payer: Medicare Other

## 2020-11-08 ENCOUNTER — Emergency Department (HOSPITAL_COMMUNITY)
Admission: EM | Admit: 2020-11-08 | Discharge: 2020-11-08 | Disposition: A | Discharge status: Home / Self Care | Payer: Medicare Other | Attending: Emergency Medicine | Admitting: Emergency Medicine

## 2020-11-08 ENCOUNTER — Encounter (HOSPITAL_COMMUNITY): Payer: Self-pay | Admitting: Emergency Medicine

## 2020-11-08 DIAGNOSIS — Y92410 Unspecified street and highway as the place of occurrence of the external cause: Secondary | ICD-10-CM | POA: Insufficient documentation

## 2020-11-08 DIAGNOSIS — M79641 Pain in right hand: Secondary | ICD-10-CM | POA: Insufficient documentation

## 2020-11-08 DIAGNOSIS — F17210 Nicotine dependence, cigarettes, uncomplicated: Secondary | ICD-10-CM | POA: Insufficient documentation

## 2020-11-08 DIAGNOSIS — Z79899 Other long term (current) drug therapy: Secondary | ICD-10-CM | POA: Diagnosis not present

## 2020-11-08 DIAGNOSIS — I10 Essential (primary) hypertension: Secondary | ICD-10-CM | POA: Insufficient documentation
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
## 2020-11-08 NOTE — Discharge Instructions (Signed)
Please use Tylenol or ibuprofen for pain.  You may use 600 mg ibuprofen every 6 hours or 1000 mg of Tylenol every 6 hours.  You may choose to alternate between the 2.  This would be most effective.  Not to exceed 4 g of Tylenol within 24 hours.  Not to exceed 3200 mg ibuprofen 24 hours.  

## 2020-11-12 DIAGNOSIS — M25561 Pain in right knee: Secondary | ICD-10-CM | POA: Diagnosis not present

## 2020-11-12 DIAGNOSIS — M25562 Pain in left knee: Secondary | ICD-10-CM | POA: Diagnosis not present

## 2020-11-15 ENCOUNTER — Other Ambulatory Visit: Payer: Self-pay | Admitting: Family Medicine

## 2020-11-17 DIAGNOSIS — S8002XD Contusion of left knee, subsequent encounter: Secondary | ICD-10-CM | POA: Diagnosis not present

## 2020-11-17 DIAGNOSIS — S8001XD Contusion of right knee, subsequent encounter: Secondary | ICD-10-CM | POA: Diagnosis not present

## 2020-11-17 DIAGNOSIS — M6281 Muscle weakness (generalized): Secondary | ICD-10-CM | POA: Diagnosis not present

## 2020-11-17 DIAGNOSIS — M25661 Stiffness of right knee, not elsewhere classified: Secondary | ICD-10-CM | POA: Diagnosis not present

## 2020-11-17 DIAGNOSIS — M25662 Stiffness of left knee, not elsewhere classified: Secondary | ICD-10-CM | POA: Diagnosis not present

## 2021-03-16 ENCOUNTER — Other Ambulatory Visit: Payer: Self-pay | Admitting: Family Medicine

## 2021-04-14 ENCOUNTER — Telehealth: Payer: Self-pay | Admitting: Family Medicine

## 2021-04-14 NOTE — Telephone Encounter (Signed)
Patient called in requesting a refill for LORazepam (ATIVAN) 0.5 MG tablet [[id number]]  to be sent to her pharmacy until her appt on May 8th. ? ?Please advise. ?

## 2021-04-15 ENCOUNTER — Other Ambulatory Visit: Payer: Self-pay | Admitting: Family Medicine

## 2021-04-15 DIAGNOSIS — F419 Anxiety disorder, unspecified: Secondary | ICD-10-CM

## 2021-04-15 MED ORDER — LORAZEPAM 0.5 MG PO TABS: 0.5000 mg | ORAL_TABLET | Freq: Two times a day (BID) | ORAL | 0 refills | Status: DC | PRN

## 2021-04-15 NOTE — Telephone Encounter (Signed)
Refill sent.

## 2021-04-16 NOTE — Telephone Encounter (Signed)
Noted  

## 2021-05-25 ENCOUNTER — Encounter: Payer: Self-pay | Admitting: Family Medicine

## 2021-05-25 ENCOUNTER — Ambulatory Visit (INDEPENDENT_AMBULATORY_CARE_PROVIDER_SITE_OTHER): Payer: Medicare Other | Admitting: Family Medicine

## 2021-05-25 VITALS — BP 120/72 | HR 85 | Temp 98.1°F | Ht 66.0 in | Wt 168.6 lb

## 2021-05-25 DIAGNOSIS — M25512 Pain in left shoulder: Secondary | ICD-10-CM

## 2021-05-25 DIAGNOSIS — I10 Essential (primary) hypertension: Secondary | ICD-10-CM

## 2021-05-25 DIAGNOSIS — F419 Anxiety disorder, unspecified: Secondary | ICD-10-CM | POA: Diagnosis not present

## 2021-05-25 DIAGNOSIS — Z720 Tobacco use: Secondary | ICD-10-CM | POA: Diagnosis not present

## 2021-05-25 DIAGNOSIS — R7301 Impaired fasting glucose: Secondary | ICD-10-CM

## 2021-05-25 DIAGNOSIS — L309 Dermatitis, unspecified: Secondary | ICD-10-CM | POA: Diagnosis not present

## 2021-05-25 DIAGNOSIS — Z1211 Encounter for screening for malignant neoplasm of colon: Secondary | ICD-10-CM | POA: Diagnosis not present

## 2021-05-25 DIAGNOSIS — R87810 Cervical high risk human papillomavirus (HPV) DNA test positive: Secondary | ICD-10-CM

## 2021-05-25 DIAGNOSIS — K219 Gastro-esophageal reflux disease without esophagitis: Secondary | ICD-10-CM

## 2021-05-25 DIAGNOSIS — E785 Hyperlipidemia, unspecified: Secondary | ICD-10-CM

## 2021-05-25 LAB — CBC WITH DIFFERENTIAL/PLATELET
Basophils Absolute: 0.1 10*3/uL (ref 0.0–0.1)
Basophils Relative: 1 % (ref 0.0–3.0)
Eosinophils Absolute: 0.2 10*3/uL (ref 0.0–0.7)
Eosinophils Relative: 2.7 % (ref 0.0–5.0)
HCT: 34.4 % — ABNORMAL LOW (ref 36.0–46.0)
Hemoglobin: 11.2 g/dL — ABNORMAL LOW (ref 12.0–15.0)
Lymphocytes Relative: 37.5 % (ref 12.0–46.0)
Lymphs Abs: 2.2 10*3/uL (ref 0.7–4.0)
MCHC: 32.5 g/dL (ref 30.0–36.0)
MCV: 83.2 fl (ref 78.0–100.0)
Monocytes Absolute: 0.5 10*3/uL (ref 0.1–1.0)
Monocytes Relative: 7.8 % (ref 3.0–12.0)
Neutro Abs: 3 10*3/uL (ref 1.4–7.7)
Neutrophils Relative %: 51 % (ref 43.0–77.0)
Platelets: 314 10*3/uL (ref 150.0–400.0)
RBC: 4.14 Mil/uL (ref 3.87–5.11)
RDW: 14.8 % (ref 11.5–15.5)
WBC: 6 10*3/uL (ref 4.0–10.5)

## 2021-05-25 LAB — COMPREHENSIVE METABOLIC PANEL
ALT: 9 U/L (ref 0–35)
AST: 12 U/L (ref 0–37)
Albumin: 4.4 g/dL (ref 3.5–5.2)
Alkaline Phosphatase: 69 U/L (ref 39–117)
BUN: 12 mg/dL (ref 6–23)
CO2: 27 mEq/L (ref 19–32)
Calcium: 10 mg/dL (ref 8.4–10.5)
Chloride: 103 mEq/L (ref 96–112)
Creatinine, Ser: 0.93 mg/dL (ref 0.40–1.20)
GFR: 60.96 mL/min (ref >60.00)
Glucose, Bld: 92 mg/dL (ref 70–99)
Potassium: 4 mEq/L (ref 3.5–5.1)
Sodium: 137 mEq/L (ref 135–145)
Total Bilirubin: 0.4 mg/dL (ref 0.2–1.2)
Total Protein: 8.1 g/dL (ref 6.0–8.3)

## 2021-05-25 LAB — LIPID PANEL
Cholesterol: 129 mg/dL (ref 0–200)
HDL: 26.4 mg/dL — ABNORMAL LOW (ref >39.00)
LDL Cholesterol: 80 mg/dL (ref 0–99)
NonHDL: 102.82
Total CHOL/HDL Ratio: 5
Triglycerides: 113 mg/dL (ref 0.0–149.0)
VLDL: 22.6 mg/dL (ref 0.0–40.0)

## 2021-05-25 LAB — HEMOGLOBIN A1C: Hgb A1c MFr Bld: 6.3 % (ref 4.6–6.5)

## 2021-05-25 MED ORDER — HYDROCHLOROTHIAZIDE 50 MG PO TABS: 50.0000 mg | ORAL_TABLET | Freq: Every day | ORAL | 1 refills | Status: DC

## 2021-05-25 MED ORDER — LORAZEPAM 0.5 MG PO TABS: 0.5000 mg | ORAL_TABLET | Freq: Two times a day (BID) | ORAL | 0 refills | Status: DC | PRN

## 2021-05-25 MED ORDER — AMLODIPINE BESYLATE 10 MG PO TABS: ORAL_TABLET | ORAL | 1 refills | Status: DC

## 2021-05-25 MED ORDER — TRIAMCINOLONE ACETONIDE 0.1 % EX CREA: 1.0000 "application " | TOPICAL_CREAM | Freq: Two times a day (BID) | CUTANEOUS | 1 refills | Status: DC

## 2021-05-25 MED ORDER — LISINOPRIL 40 MG PO TABS: 40.0000 mg | ORAL_TABLET | Freq: Every day | ORAL | 1 refills | Status: DC

## 2021-05-25 MED ORDER — MELOXICAM 7.5 MG PO TABS
7.5000 mg | ORAL_TABLET | Freq: Every day | ORAL | 0 refills | Status: DC
Start: 2021-05-25 — End: 2021-08-03

## 2021-05-25 MED ORDER — ATORVASTATIN CALCIUM 20 MG PO TABS: 20.0000 mg | ORAL_TABLET | Freq: Every day | ORAL | 1 refills | Status: DC

## 2021-05-25 NOTE — Progress Notes (Signed)
?Alexandra Flores ?DOB: November 03, 1945 ?Encounter date: 05/25/2021 ? ?This is a 76 y.o. female who presents with ?Chief Complaint  ?Patient presents with  ? Medication Refill  ?  Patient requests refills on Triamcinolone cream  ? Shoulder Pain  ?  Patient complains of left shoulder pain x1 month, especially noted with overhead motion or when reaching behind her back, no known injury  ? Cough  ?  Non-productive x3 weeks, productive with white sputum x1 week and states she was diagnosed with Influenza on April 16th after a trip to [place]  ? ? ?History of present illness: ?Last visit 01/16/20.  ? ?Left shoulder is aching some. Bothers her if putting arm behind back, but not hurting all the time. Doesn't remember doing something to it. Did have problem with right shoulder in the past and got cortisone shot and it corrected. Went to [place] in April and got sick while out there. She has a little residual phlegm in throat and she is coughing this up. Not wheezing or short of breath.  ? ?Hypertension: Amlodipine 10 mg, hydrochlorothiazide 50 mg - does check at home and gets normal numbers. does run well at home.  ?Tobacco XBD:ZHGDJME about 1 pack per day. No interest in quitting. (Same as before) ?Hyperlipidemia: Lipitor 20 mg daily; no aches or pains with this.  ?GERD: Over-the-counter medications just as needed. Doing well with this.  ?  ?Did not complete Cologuard kit - just hasn't thought about.  ? ?Gynecologist: hasn't seen one in years. Last time was she thinks 2017; but at least was seen in 2014; was told only prn follow up per patient. This was after hpv+ pap. She doesn't want to complete this again.  ? ?Doesn't want mammogram.  ?  ?Not interested in lung cancer screening. Feels she has made it this long without anything wrong; worries she will give up if she gets dx of cancer. She still states she does not want shingles.  ?  ?Doesn't want shingrix vaccine. States will get COVID booster.  ?  ?Declines bone density.  ? ?No  Known Allergies ?Current Meds  ?Medication Sig  ? amLODipine (NORVASC) 10 MG tablet *NEED OFFICE VISIT* TAKE 1 TABLET BY MOUTH EVERY DAY  ? atorvastatin (LIPITOR) 20 MG tablet *NEED OFFICE VISIT* TAKE 1 TABLET BY MOUTH EVERY DAY  ? hydrochlorothiazide (HYDRODIURIL) 50 MG tablet TAKE 1 TABLET BY MOUTH EVERY DAY  ? lisinopril (ZESTRIL) 40 MG tablet *NEED OFFICE VISIT* TAKE 1 TABLET BY MOUTH EVERY DAY  ? LORazepam (ATIVAN) 0.5 MG tablet Take 1 tablet (0.5 mg total) by mouth 2 (two) times daily as needed for anxiety.  ? Multiple Vitamins-Minerals (MULTIVITAMIN ADULTS PO) Take by mouth.  ? triamcinolone cream (KENALOG) 0.1 % Apply 1 application topically 2 (two) times daily.  ? ? ?Review of Systems  ?Constitutional:  Negative for activity change, appetite change, chills, fatigue, fever and unexpected weight change.  ?HENT:  Negative for congestion, ear pain, hearing loss, sinus pressure, sinus pain, sore throat and trouble swallowing.   ?Eyes:  Negative for pain and visual disturbance.  ?Respiratory:  Negative for cough, chest tightness, shortness of breath and wheezing.   ?Cardiovascular:  Negative for chest pain, palpitations and leg swelling.  ?Gastrointestinal:  Negative for abdominal pain, blood in stool, constipation, diarrhea, nausea and vomiting.  ?Genitourinary:  Negative for difficulty urinating and menstrual problem.  ?Musculoskeletal:  Positive for arthralgias (left shoulder pain). Negative for back pain.  ?Skin:  Negative for rash.  ?Neurological:  Negative for dizziness, weakness, numbness and headaches.  ?Hematological:  Negative for adenopathy. Does not bruise/bleed easily.  ?Psychiatric/Behavioral:  Negative for sleep disturbance and suicidal ideas. The patient is not nervous/anxious.   ? ?Objective: ? ?BP 120/72 (BP Location: Left Arm, Patient Position: Sitting, Cuff Size: Large)   Pulse 85   Temp 98.1 ?F (36.7 ?C) (Oral)   Ht '5\' 6"'  (1.676 m)   Wt 168 lb 9.6 oz (76.5 kg)   SpO2 98%   BMI 27.21  kg/m?   Weight: 168 lb 9.6 oz (76.5 kg)  ? ?BP Readings from Last 3 Encounters:  ?05/25/21 120/72  ?11/08/20 (!) 153/71  ?01/16/20 130/80  ? ?Wt Readings from Last 3 Encounters:  ?05/25/21 168 lb 9.6 oz (76.5 kg)  ?11/08/20 165 lb (74.8 kg)  ?01/16/20 169 lb 4.8 oz (76.8 kg)  ? ? ?Physical Exam ?Constitutional:   ?   General: She is not in acute distress. ?   Appearance: She is well-developed.  ?HENT:  ?   Head: Normocephalic and atraumatic.  ?   Right Ear: External ear normal.  ?   Left Ear: External ear normal.  ?   Mouth/Throat:  ?   Pharynx: No oropharyngeal exudate.  ?   Comments: edentulous ?Eyes:  ?   Conjunctiva/sclera: Conjunctivae normal.  ?   Pupils: Pupils are equal, round, and reactive to light.  ?Neck:  ?   Thyroid: No thyromegaly.  ?Cardiovascular:  ?   Rate and Rhythm: Normal rate and regular rhythm.  ?   Heart sounds: Normal heart sounds. No murmur heard. ?  No friction rub. No gallop.  ?Pulmonary:  ?   Effort: Pulmonary effort is normal.  ?   Breath sounds: Normal breath sounds.  ?Abdominal:  ?   General: Bowel sounds are normal. There is no distension.  ?   Palpations: Abdomen is soft. There is no mass.  ?   Tenderness: There is no abdominal tenderness. There is no guarding.  ?   Hernia: No hernia is present.  ?Musculoskeletal:     ?   General: No tenderness or deformity. Normal range of motion.  ?   Cervical back: Normal range of motion and neck supple.  ?   Comments: She has a slight limitation with full abduction of her shoulder 10 degrees.  Mild discomfort with Hawkins, no weakness noted of rotator cuff.  Some discomfort with posterior rotation of her shoulder.  ?Lymphadenopathy:  ?   Cervical: No cervical adenopathy.  ?Skin: ?   General: Skin is warm and dry.  ?   Findings: No rash.  ?Neurological:  ?   Mental Status: She is alert and oriented to person, place, and time.  ?   Deep Tendon Reflexes: Reflexes normal.  ?   Reflex Scores: ?     Tricep reflexes are 2+ on the right side and 2+ on  the left side. ?     Bicep reflexes are 2+ on the right side and 2+ on the left side. ?     Brachioradialis reflexes are 2+ on the right side and 2+ on the left side. ?     Patellar reflexes are 2+ on the right side and 2+ on the left side. ?Psychiatric:     ?   Speech: Speech normal.     ?   Behavior: Behavior normal.     ?   Thought Content: Thought content normal.  ? ? ?Assessment/Plan ? ?1. Essential hypertension ?Blood pressure is  well controlled. Continue with current medication.  ?- amLODipine (NORVASC) 10 MG tablet; TAKE 1 TABLET BY MOUTH EVERY DAY  Dispense: 90 tablet; Refill: 1 ?- hydrochlorothiazide (HYDRODIURIL) 50 MG tablet; Take 1 tablet (50 mg total) by mouth daily.  Dispense: 90 tablet; Refill: 1 ?- lisinopril (ZESTRIL) 40 MG tablet; Take 1 tablet (40 mg total) by mouth daily.  Dispense: 90 tablet; Refill: 1 ?- CBC with Differential/Platelet; Future ?- Comprehensive metabolic panel; Future ?- Comprehensive metabolic panel ?- CBC with Differential/Platelet ? ?2. Gastroesophageal reflux disease, unspecified whether esophagitis present ?Asymptomatic.only otc medsprn. ? ?3. Anxiety ?Generally well controlled. Will use ativan for anxiety rarely with travel/flying.  ?- LORazepam (ATIVAN) 0.5 MG tablet; Take 1 tablet (0.5 mg total) by mouth 2 (two) times daily as needed for anxiety.  Dispense: 30 tablet; Refill: 0 ? ?4. Colon cancer screening ?She does agree to colon cancer screening so order was placed. ?- Cologuard ? ?5. Tobacco use ?Patient states she has no interest in quitting smoking.  I have encouraged her to reach out and let us know if she changes her mind.  We also discussed lung cancer screening but she declines this. ? ?6. Left shoulder pain, unspecified chronicity ?We will get x-ray today to make sure no significant abnormalities.  However, we discussed working on range of motion of the shoulder.  We discussed icing to help with inflammation and trial of meloxicam for at least 2 weeks.  If no  better with this, I would recommend physical therapy.  She also could see Ortho.  I encouraged her to try these other modalities rather than jumping straight to injection of her shoulder since she is not having pain

## 2021-05-27 ENCOUNTER — Other Ambulatory Visit

## 2021-05-27 ENCOUNTER — Ambulatory Visit (INDEPENDENT_AMBULATORY_CARE_PROVIDER_SITE_OTHER)

## 2021-05-27 ENCOUNTER — Ambulatory Visit

## 2021-05-27 ENCOUNTER — Ambulatory Visit (INDEPENDENT_AMBULATORY_CARE_PROVIDER_SITE_OTHER): Payer: Medicare Other

## 2021-05-27 VITALS — BP 120/72 | Ht 66.0 in | Wt 168.0 lb

## 2021-05-27 DIAGNOSIS — Z0000 Encounter for general adult medical examination without abnormal findings: Secondary | ICD-10-CM | POA: Diagnosis not present

## 2021-05-27 DIAGNOSIS — M25512 Pain in left shoulder: Secondary | ICD-10-CM | POA: Diagnosis not present

## 2021-05-27 NOTE — Progress Notes (Addendum)
? ?Subjective:  ? Alexandra Flores is a 76 y.o. female who presents for Medicare Annual (Subsequent) preventive examination. ? ?Review of Systems    ?Virtual Visit via Telephone Note ? ?I connected with  Servando Snare on 05/27/21 at  1:30 PM EDT by telephone and verified that I am speaking with the correct person using two identifiers. ? ?Location: ?Patient: Home ?Provider: Office ?Persons participating in the virtual visit: patient/Nurse Health Advisor ?  ?I discussed the limitations, risks, security and privacy concerns of performing an evaluation and management service by telephone and the availability of in person appointments. The patient expressed understanding and agreed to proceed. ? ?Interactive audio and video telecommunications were attempted between this nurse and patient, however failed, due to patient having technical difficulties OR patient did not have access to video capability.  We continued and completed visit with audio only. ? ?Some vital signs may be absent or patient reported.  ? ?Criselda Peaches, LPN  ?Cardiac Risk Factors include: advanced age (>74mn, >>2women);hypertension;smoking/ tobacco exposure ? ?   ?Objective:  ?  ?Today's Vitals  ? 05/27/21 1335  ?BP: 120/72  ?Weight: 168 lb (76.2 kg)  ?Height: '5\' 6"'$  (1.676 m)  ? ?Body mass index is 27.12 kg/m?. ? ? ?  05/27/2021  ?  1:45 PM 05/21/2020  ?  1:50 PM 09/27/2013  ?  9:40 AM  ?Advanced Directives  ?Does Patient Have a Medical Advance Directive? No Yes Yes  ?Type of [place]ate [place]  ?Copy of [place] Chart?  No - copy requested No - copy requested  ?Would patient like information on creating a medical advance directive? No - Patient declined    ? ? ?Current Medications (verified) ?Outpatient Encounter Medications as of 05/27/2021  ?Medication Sig  ? amLODipine (NORVASC) 10 MG tablet TAKE 1 TABLET BY MOUTH EVERY DAY  ? atorvastatin (LIPITOR) 20 MG  tablet Take 1 tablet (20 mg total) by mouth daily.  ? hydrochlorothiazide (HYDRODIURIL) 50 MG tablet Take 1 tablet (50 mg total) by mouth daily.  ? lisinopril (ZESTRIL) 40 MG tablet Take 1 tablet (40 mg total) by mouth daily.  ? LORazepam (ATIVAN) 0.5 MG tablet Take 1 tablet (0.5 mg total) by mouth 2 (two) times daily as needed for anxiety.  ? meloxicam (MOBIC) 7.5 MG tablet Take 1 tablet (7.5 mg total) by mouth daily.  ? Multiple Vitamins-Minerals (MULTIVITAMIN ADULTS PO) Take by mouth.  ? triamcinolone cream (KENALOG) 0.1 % Apply 1 application. topically 2 (two) times daily.  ? ?No facility-administered encounter medications on file as of 05/27/2021.  ? ? ?Allergies (verified) ?Patient has no known allergies.  ? ?History: ?Past Medical History:  ?Diagnosis Date  ? GERD (gastroesophageal reflux disease)   ? Hyperlipemia 09/30/2015  ? Hypertension   ? Tobacco use 02/10/2012  ? ?Past Surgical History:  ?Procedure Laterality Date  ? BREAST SURGERY    ? benign cyst removal  ? ?Family History  ?Problem Relation Age of Onset  ? Stroke Mother 940 ? Hypertension Mother   ? Cancer Sister 540 ?     'stomach cancer'   ? Arthritis Father   ? Eczema Father   ? HIV/AIDS Brother   ? Asthma Sister   ? ?Social History  ? ?Socioeconomic History  ? Marital status: Married  ?  Spouse name: Not on file  ? Number of children: Not on file  ? Years  of education: Not on file  ? Highest education level: Not on file  ?Occupational History  ? Not on file  ?Tobacco Use  ? Smoking status: Every Day  ?  Packs/day: 1.00  ?  Years: 48.00  ?  Pack years: 48.00  ?  Types: Cigarettes  ? Smokeless tobacco: Never  ? Tobacco comments:  ?  per patient not even a pack a day; takes 3 puffs and puts it out   ?Substance and Sexual Activity  ? Alcohol use: Yes  ?  Alcohol/week: 0.0 standard drinks  ?  Comment: occ   ? Drug use: Not on file  ? Sexual activity: Not on file  ?Other Topics Concern  ? Not on file  ?Social History Narrative  ? Work or School: none,  retired, worked at [employer] for 25 years   ?   ? Home Situation: lives with husband; mother passed in 2017  ?   ? Spiritual Beliefs: methodist, christian  ?   ? Lifestyle: does bowling every Wednesday, no regular exercise otherwise, diet is not great - husband is active and exercises  ?   ?   ? ?Social Determinants of Health  ? ?Financial Resource Strain: Low Risk   ? Difficulty of Paying Living Expenses: Not hard at all  ?Food Insecurity: No Food Insecurity  ? Worried About Charity fundraiser in the Last Year: Never true  ? Ran Out of Food in the Last Year: Never true  ?Transportation Needs: No Transportation Needs  ? Lack of Transportation (Medical): No  ? Lack of Transportation (Non-Medical): No  ?Physical Activity: Inactive  ? Days of Exercise per Week: 0 days  ? Minutes of Exercise per Session: 0 min  ?Stress: No Stress Concern Present  ? Feeling of Stress : Not at all  ?Social Connections: Socially Integrated  ? Frequency of Communication with Friends and Family: More than three times a week  ? Frequency of Social Gatherings with Friends and Family: More than three times a week  ? Attends Religious Services: More than 4 times per year  ? Active Member of Clubs or Organizations: Yes  ? Attends Archivist Meetings: More than 4 times per year  ? Marital Status: Married  ? ? ?Tobacco Counseling ?Ready to quit: No ?Counseling given: Yes ?Tobacco comments: per patient not even a pack a day; takes 3 puffs and puts it out  ? ? ?Clinical Intake: ? ?Pre-visit preparation completed: No ?Diabetic?  No ? ? ?Activities of Daily Living ? ?  05/27/2021  ?  1:44 PM  ?In your present state of health, do you have any difficulty performing the following activities:  ?Hearing? 0  ?Vision? 0  ?Difficulty concentrating or making decisions? 0  ?Walking or climbing stairs? 0  ?Dressing or bathing? 0  ?Doing errands, shopping? 0  ?Preparing Food and eating ? N  ?Using the Toilet? N  ?In the past six months, have you accidently  leaked urine? N  ?Do you have problems with loss of bowel control? N  ?Managing your Medications? N  ?Managing your Finances? N  ?Housekeeping or managing your Housekeeping? N  ? ? ?Patient Care Team: ?Caren Macadam, MD as PCP - General (Family Medicine) ?Azucena Fallen, MD as Consulting Physician (Obstetrics and Gynecology) ? ?Indicate any recent Medical Services you may have received from other than [facility] providers in the past year (date may be approximate). ? ?   ?Assessment:  ? This is a routine wellness  examination for Meri. ? ?Hearing/Vision screen ?Hearing Screening - Comments:: No hearing difficulty ?Vision Screening - Comments:: Wears reading glasses. Followed by [facility] ? ?Dietary issues and exercise activities discussed: ?Exercise limited by: None identified ? ? Goals Addressed   ? ?  ?  ?  ?  ?  ? This Visit's Progress  ?   Increase physical activity (pt-stated)     ? ?  ? ?Depression Screen ? ?  05/27/2021  ?  1:41 PM 05/25/2021  ? 12:43 PM 03/21/2019  ?  9:21 AM 03/29/2017  ? 11:05 AM 09/30/2015  ?  8:31 AM 07/04/2015  ? 10:33 AM 09/27/2013  ?  9:40 AM  ?PHQ 2/9 Scores  ?PHQ - 2 Score 0 0 0 0 0 0 0  ?PHQ- 9 Score 0 0       ?  ?Fall Risk ? ?  05/27/2021  ?  1:45 PM 05/21/2020  ?  1:52 PM 03/21/2019  ?  9:20 AM 03/29/2017  ? 11:05 AM 09/30/2015  ?  8:31 AM  ?Fall Risk   ?Falls in the past year? 0 0 0 No No  ?Number falls in past yr: 0 0 0    ?Injury with Fall? 0 0 0    ?Risk for fall due to : No Fall Risks Impaired vision     ?Follow up  Falls prevention discussed     ? ? ?FALL RISK PREVENTION PERTAINING TO THE HOME: ? ?Any stairs in or around the home? No ?If so, are there any without handrails? No  ?Home free of loose throw rugs in walkways, pet beds, electrical cords, etc? Yes  ?Adequate lighting in your home to reduce risk of falls? Yes  ? ?ASSISTIVE DEVICES UTILIZED TO PREVENT FALLS: ? ?Life alert? No  ?Use of a cane, walker or w/c? No  ?Grab bars in the bathroom? No  ?Shower chair or bench in shower?  Yes  ?Elevated toilet seat or a handicapped toilet? Yes  ? ?TIMED UP AND GO: ? ?Was the test performed? No . Audio Visit ? ?Cognitive Function: ?  ?  ? ?  05/27/2021  ?  1:45 PM 05/21/2020  ?  1:54 PM  ?6CIT

## 2021-05-27 NOTE — Patient Instructions (Addendum)
?Alexandra Flores , ?Thank you for taking time to come for your Medicare Wellness Visit. I appreciate your ongoing commitment to your health goals. Please review the following plan we discussed and let me know if I can assist you in the future.  ? ?These are the goals we discussed: ? Goals   ? ?   Increase physical activity (pt-stated)   ?   Patient Stated   ?   Lose weight in stomach  ?  ?   Quit smoking / using tobacco   ? ?  ?  ?This is a list of the screening recommended for you and due dates:  ?Health Maintenance  ?Topic Date Due  ? COVID-19 Vaccine (4 - Booster for Pfizer series) 06/11/2021*  ? Zoster (Shingles) Vaccine (1 of 2) 08/25/2021*  ? Pneumonia Vaccine (1 - PCV) 05/27/2022*  ? Mammogram  05/27/2022*  ? DEXA scan (bone density measurement)  05/27/2022*  ? Flu Shot  08/18/2021  ? Tetanus Vaccine  02/23/2022  ? Colon Cancer Screening  09/28/2023  ? Hepatitis C Screening: USPSTF Recommendation to screen - Ages 12-79 yo.  Completed  ? HPV Vaccine  Aged Out  ?*Topic was postponed. The date shown is not the original due date.  ? ? ?Advanced directives: No ? ?Conditions/risks identified: None ? ?Next appointment: Follow up in one year for your annual wellness visit  ? ? ?Preventive Care 39 Years and Older, Female ?Preventive care refers to lifestyle choices and visits with your health care provider that can promote health and wellness. ?What does preventive care include? ?A yearly physical exam. This is also called an annual well check. ?Dental exams once or twice a year. ?Routine eye exams. Ask your health care provider how often you should have your eyes checked. ?Personal lifestyle choices, including: ?Daily care of your teeth and gums. ?Regular physical activity. ?Eating a healthy diet. ?Avoiding tobacco and drug use. ?Limiting alcohol use. ?Practicing safe sex. ?Taking low-dose aspirin every day. ?Taking vitamin and mineral supplements as recommended by your health care provider. ?What happens during an  annual well check? ?The services and screenings done by your health care provider during your annual well check will depend on your age, overall health, lifestyle risk factors, and family history of disease. ?Counseling  ?Your health care provider may ask you questions about your: ?Alcohol use. ?Tobacco use. ?Drug use. ?Emotional well-being. ?Home and relationship well-being. ?Sexual activity. ?Eating habits. ?History of falls. ?Memory and ability to understand (cognition). ?Work and work Statistician. ?Reproductive health. ?Screening  ?You may have the following tests or measurements: ?Height, weight, and BMI. ?Blood pressure. ?Lipid and cholesterol levels. These may be checked every 5 years, or more frequently if you are over 36 years old. ?Skin check. ?Lung cancer screening. You may have this screening every year starting at age 42 if you have a 30-pack-year history of smoking and currently smoke or have quit within the past 15 years. ?Fecal occult blood test (FOBT) of the stool. You may have this test every year starting at age 59. ?Flexible sigmoidoscopy or colonoscopy. You may have a sigmoidoscopy every 5 years or a colonoscopy every 10 years starting at age 91. ?Hepatitis C blood test. ?Hepatitis B blood test. ?Sexually transmitted disease (STD) testing. ?Diabetes screening. This is done by checking your blood sugar (glucose) after you have not eaten for a while (fasting). You may have this done every 1-3 years. ?Bone density scan. This is done to screen for osteoporosis. You may have this  done starting at age 75. ?Mammogram. This may be done every 1-2 years. Talk to your health care provider about how often you should have regular mammograms. ?Talk with your health care provider about your test results, treatment options, and if necessary, the need for more tests. ?Vaccines  ?Your health care provider may recommend certain vaccines, such as: ?Influenza vaccine. This is recommended every year. ?Tetanus,  diphtheria, and acellular pertussis (Tdap, Td) vaccine. You may need a Td booster every 10 years. ?Zoster vaccine. You may need this after age 81. ?Pneumococcal 13-valent conjugate (PCV13) vaccine. One dose is recommended after age 50. ?Pneumococcal polysaccharide (PPSV23) vaccine. One dose is recommended after age 79. ?Talk to your health care provider about which screenings and vaccines you need and how often you need them. ?This information is not intended to replace advice given to you by your health care provider. Make sure you discuss any questions you have with your health care provider. ?Document Released: 01/31/2015 Document Revised: 09/24/2015 Document Reviewed: 11/05/2014 ?Elsevier Interactive Patient Education ? 2017 [place]. ? ?Fall Prevention in the Home ?Falls can cause injuries. They can happen to people of all ages. There are many things you can do to make your home safe and to help prevent falls. ?What can I do on the outside of my home? ?Regularly fix the edges of walkways and driveways and fix any cracks. ?Remove anything that might make you trip as you walk through a door, such as a raised step or threshold. ?Trim any bushes or trees on the path to your home. ?Use bright outdoor lighting. ?Clear any walking paths of anything that might make someone trip, such as rocks or tools. ?Regularly check to see if handrails are loose or broken. Make sure that both sides of any steps have handrails. ?Any raised decks and porches should have guardrails on the edges. ?Have any leaves, snow, or ice cleared regularly. ?Use sand or salt on walking paths during winter. ?Clean up any spills in your garage right away. This includes oil or grease spills. ?What can I do in the bathroom? ?Use night lights. ?Install grab bars by the toilet and in the tub and shower. Do not use towel bars as grab bars. ?Use non-skid mats or decals in the tub or shower. ?If you need to sit down in the shower, use a plastic,  non-slip stool. ?Keep the floor dry. Clean up any water that spills on the floor as soon as it happens. ?Remove soap buildup in the tub or shower regularly. ?Attach bath mats securely with double-sided non-slip rug tape. ?Do not have throw rugs and other things on the floor that can make you trip. ?What can I do in the bedroom? ?Use night lights. ?Make sure that you have a light by your bed that is easy to reach. ?Do not use any sheets or blankets that are too big for your bed. They should not hang down onto the floor. ?Have a firm chair that has side arms. You can use this for support while you get dressed. ?Do not have throw rugs and other things on the floor that can make you trip. ?What can I do in the kitchen? ?Clean up any spills right away. ?Avoid walking on wet floors. ?Keep items that you use a lot in easy-to-reach places. ?If you need to reach something above you, use a strong step stool that has a grab bar. ?Keep electrical cords out of the way. ?Do not use floor polish or wax  that makes floors slippery. If you must use wax, use non-skid floor wax. ?Do not have throw rugs and other things on the floor that can make you trip. ?What can I do with my stairs? ?Do not leave any items on the stairs. ?Make sure that there are handrails on both sides of the stairs and use them. Fix handrails that are broken or loose. Make sure that handrails are as long as the stairways. ?Check any carpeting to make sure that it is firmly attached to the stairs. Fix any carpet that is loose or worn. ?Avoid having throw rugs at the top or bottom of the stairs. If you do have throw rugs, attach them to the floor with carpet tape. ?Make sure that you have a light switch at the top of the stairs and the bottom of the stairs. If you do not have them, ask someone to add them for you. ?What else can I do to help prevent falls? ?Wear shoes that: ?Do not have high heels. ?Have rubber bottoms. ?Are comfortable and fit you well. ?Are closed  at the toe. Do not wear sandals. ?If you use a stepladder: ?Make sure that it is fully opened. Do not climb a closed stepladder. ?Make sure that both sides of the stepladder are locked into place. ?Ask someone to hold

## 2021-06-01 ENCOUNTER — Telehealth: Admitting: Family Medicine

## 2021-06-01 NOTE — Progress Notes (Signed)
Called pt to prepare for vitrual visit, no answer. Sent link to join visit and left link open for 11 minutes, pt did not show up.

## 2021-06-04 ENCOUNTER — Ambulatory Visit (INDEPENDENT_AMBULATORY_CARE_PROVIDER_SITE_OTHER): Payer: Medicare Other | Admitting: Family Medicine

## 2021-06-04 ENCOUNTER — Ambulatory Visit: Payer: Medicare Other | Admitting: Internal Medicine

## 2021-06-04 ENCOUNTER — Encounter: Payer: Self-pay | Admitting: Family Medicine

## 2021-06-04 DIAGNOSIS — R197 Diarrhea, unspecified: Secondary | ICD-10-CM

## 2021-06-04 DIAGNOSIS — R112 Nausea with vomiting, unspecified: Secondary | ICD-10-CM | POA: Diagnosis not present

## 2021-06-04 MED ORDER — ONDANSETRON HCL 4 MG PO TABS: 4.0000 mg | ORAL_TABLET | Freq: Two times a day (BID) | ORAL | 0 refills | Status: DC

## 2021-06-04 NOTE — Patient Instructions (Signed)
-  no cow milk, cheese, ice cream, yogurt or other dairy products for the next 1-2 weeks  -drink plenty of fluids  -imodium (loperamide) for the diarrhea per instructions  -I sent the medication(s) we discussed to your pharmacy: Meds ordered this encounter  Medications   ondansetron (ZOFRAN) 4 MG tablet    Sig: Take 1 tablet (4 mg total) by mouth 2 (two) times daily.    Dispense:  20 tablet    Refill:  0    I hope you are feeling all better soon!  Seek in person care promptly if your symptoms worsen, new concerns arise or you are not improving with treatment.  It was nice to meet you today. I help [facility] out with telemedicine visits on Tuesdays and Thursdays and am happy to help if you need a virtual follow up visit on those days. Otherwise, if you have any concerns or questions following this visit please schedule a follow up visit with your Primary Care office or seek care at a local urgent care clinic to avoid delays in care. If you are having severe or life threatening symptoms please call 911 and/or go to the nearest emergency room.

## 2021-06-04 NOTE — Progress Notes (Signed)
Virtual Visit via Telephone Note  I connected with Alexandra Flores on 06/04/21 at  4:40 PM EDT by telephone and verified that I am speaking with the correct person using two identifiers.   I discussed the limitations of performing an evaluation and management service by telephone and requested permission for a phone visit. The patient expressed understanding and agreed to proceed.  Location patient:  [name] Location provider: work or home office Participants present for the call: patient, provider Patient did not have a visit with me in the prior 7 days to address this/these issue(s).   History of Present Illness:  Acute telemedicine visit for diarrhea, NV: -Onset: about 4 days ago -Symptoms include: started with nausea and vomiting the first day, nausea and vomiting resolved, but still having some diarrhea - stools are starting to become more form now, still having some nausea -Denies:fever, melena, hematochezia, hematemesis, bilious emesis, abd or pelvic pain, respiratory symptoms -Has tried:drinking fluids, smoothies -Pertinent past medical history: see below -Pertinent medication allergies: No Known Allergies -COVID-19 vaccine status: Immunization History  Administered Date(s) Administered   PFIZER(Purple Top)SARS-COV-2 Vaccination 03/15/2019, 04/10/2019, 09/02/2020   Tdap 02/24/2012      Past Medical History:  Diagnosis Date   GERD (gastroesophageal reflux disease)    Hyperlipemia 09/30/2015   Hypertension    Tobacco use 02/10/2012    Current Outpatient Medications on File Prior to Visit  Medication Sig Dispense Refill   amLODipine (NORVASC) 10 MG tablet TAKE 1 TABLET BY MOUTH EVERY DAY 90 tablet 1   atorvastatin (LIPITOR) 20 MG tablet Take 1 tablet (20 mg total) by mouth daily. 90 tablet 1   hydrochlorothiazide (HYDRODIURIL) 50 MG tablet Take 1 tablet (50 mg total) by mouth daily. 90 tablet 1   lisinopril (ZESTRIL) 40 MG tablet Take 1 tablet (40 mg total) by mouth daily. 90  tablet 1   LORazepam (ATIVAN) 0.5 MG tablet Take 1 tablet (0.5 mg total) by mouth 2 (two) times daily as needed for anxiety. 30 tablet 0   meloxicam (MOBIC) 7.5 MG tablet Take 1 tablet (7.5 mg total) by mouth daily. 30 tablet 0   Multiple Vitamins-Minerals (MULTIVITAMIN ADULTS PO) Take by mouth.     triamcinolone cream (KENALOG) 0.1 % Apply 1 application. topically 2 (two) times daily. 45 g 1   No current facility-administered medications on file prior to visit.    Observations/Objective: Patient sounds cheerful and well on the phone. I do not appreciate any SOB. Speech and thought processing are grossly intact. Patient reported vitals: T 97.8   Assessment and Plan:  Diarrhea, unspecified type  Nausea and vomiting, unspecified vomiting type  -we discussed possible serious and likely etiologies, options for evaluation and workup, limitations of telemedicine visit vs in person visit, treatment, treatment risks and precautions. Pt prefers to treat via telemedicine empirically rather than in person at this moment. Query viral gastroenteritis given prevalence in community currently, symptoms and that patient is improving vs other. She has opted to try dairy free diet, oral hydration, zofran if needed, imodium if needed.  Advised to seek prompt virtual visit or in person care if worsening, new symptoms arise, or if is not improving with treatment as expected per our conversation of expected course. Discussed options for follow up care. Did let this patient know that I do telemedicine on Tuesdays and Thursdays for [place] and those are the days I am logged into the system. Advised to schedule follow up visit with PCP, [place] virtual visits or [facility]  if any further questions or concerns to avoid delays in care.   I discussed the assessment and treatment plan with the patient. The patient was provided an opportunity to ask questions and all were answered. The patient agreed with the plan and  demonstrated an understanding of the instructions.    Follow Up Instructions:  I did not refer this patient for an OV with me in the next 24 hours for this/these issue(s).  I discussed the assessment and treatment plan with the patient. The patient was provided an opportunity to ask questions and all were answered. The patient agreed with the plan and demonstrated an understanding of the instructions.   I spent 17 minutes on the date of this visit in the care of this patient. See summary of tasks completed to properly care for this patient in the detailed notes above which also included counseling of above, review of PMH, medications, allergies, evaluation of the patient and ordering and/or  instructing patient on testing and care options.     [name], DO

## 2021-07-18 ENCOUNTER — Ambulatory Visit
Admission: EM | Admit: 2021-07-18 | Discharge: 2021-07-18 | Disposition: A | Discharge status: Home / Self Care | Payer: Medicare Other | Attending: Internal Medicine | Admitting: Internal Medicine

## 2021-07-18 DIAGNOSIS — R112 Nausea with vomiting, unspecified: Secondary | ICD-10-CM

## 2021-07-18 DIAGNOSIS — R197 Diarrhea, unspecified: Secondary | ICD-10-CM

## 2021-07-18 NOTE — ED Triage Notes (Signed)
Patient presents to Urgent Care with complaints of nausea vomiting and diarrhea since friday. Patient reports she had similar symptoms 2 weeks ago and was prescribed Zofran but it is not helping. Pt st she got those symptoms after travling.

## 2021-07-18 NOTE — Discharge Instructions (Signed)
Lab work is pending.  We will call you if there are any abnormalities.  You were given a stool sample kit to bring back your stool sample to be tested as well.  Please be bring this back at your earliest convenience.  Please follow-up with gastrointestinal doctor as well on Monday for further evaluation and management.

## 2021-07-18 NOTE — ED Provider Notes (Signed)
[facility]    CSN: [id number] Arrival date & time: 07/18/21  1202      History   Chief Complaint Chief Complaint  Patient presents with   Nausea    HPI Alexandra Flores is a 76 y.o. female.   Patient presents with nausea, vomiting, diarrhea that has been intermittent over the past few weeks.  Patient was originally seen on 5/18 at PCP and was diagnosed with viral illness and prescribed ondansetron.  Patient reports that those symptoms have resolved but have been intermittently flaring up for 2 to 3 days time periods over the past few weeks since her original symptoms.  Denies any fever, body aches, chills, associated upper respiratory symptoms.  Denies any recent travel outside the [place].  There are no aggravating or relieving factors to symptoms and there does not appear to be any particular food that is causing stomach upset.  Patient denies any history of gastrointestinal problems.  Denies blood in stool or emesis.  Denies any associated abdominal pain. Denies any known sick contacts. Patient is able to keep fluids down.      Past Medical History:  Diagnosis Date   GERD (gastroesophageal reflux disease)    Hyperlipemia 09/30/2015   Hypertension    Tobacco use 02/10/2012    Patient Active Problem List   Diagnosis Date Noted   Hyperlipemia 09/30/2015   Fibroids 03/23/2012   Tobacco use 02/10/2012   KNEE PAIN 03/05/2008   Essential hypertension 03/13/2007   GERD 03/13/2007    Past Surgical History:  Procedure Laterality Date   BREAST SURGERY     benign cyst removal    OB History   No obstetric history on file.      Home Medications    Prior to Admission medications   Medication Sig Start Date End Date Taking? Authorizing Provider  amLODipine (NORVASC) 10 MG tablet TAKE 1 TABLET BY MOUTH EVERY DAY 05/25/21   Koberlein, Andris Flurry C, MD  atorvastatin (LIPITOR) 20 MG tablet Take 1 tablet (20 mg total) by mouth daily. 05/25/21   Koberlein, Steele Berg, MD   hydrochlorothiazide (HYDRODIURIL) 50 MG tablet Take 1 tablet (50 mg total) by mouth daily. 05/25/21   Koberlein, Steele Berg, MD  lisinopril (ZESTRIL) 40 MG tablet Take 1 tablet (40 mg total) by mouth daily. 05/25/21   Koberlein, Steele Berg, MD  LORazepam (ATIVAN) 0.5 MG tablet Take 1 tablet (0.5 mg total) by mouth 2 (two) times daily as needed for anxiety. 05/25/21   Caren Macadam, MD  meloxicam (MOBIC) 7.5 MG tablet Take 1 tablet (7.5 mg total) by mouth daily. 05/25/21   Caren Macadam, MD  Multiple Vitamins-Minerals (MULTIVITAMIN ADULTS PO) Take by mouth.    [provider]  ondansetron (ZOFRAN) 4 MG tablet Take 1 tablet (4 mg total) by mouth 2 (two) times daily. 06/04/21   [name], DO  triamcinolone cream (KENALOG) 0.1 % Apply 1 application. topically 2 (two) times daily. 05/25/21   Caren Macadam, MD    Family History Family History  Problem Relation Age of Onset   Stroke Mother 29   Hypertension Mother    Cancer Sister 95       'stomach cancer'    Arthritis Father    Eczema Father    HIV/AIDS Brother    Asthma Sister     Social History Social History   Tobacco Use   Smoking status: Every Day    Packs/day: 1.00    Years: 48.00  Total pack years: 48.00    Types: Cigarettes   Smokeless tobacco: Never   Tobacco comments:    per patient not even a pack a day; takes 3 puffs and puts it out   Substance Use Topics   Alcohol use: Yes    Alcohol/week: 0.0 standard drinks of alcohol    Comment: occ      Allergies   Patient has no known allergies.   Review of Systems Review of Systems Per HPI  Physical Exam Triage Vital Signs ED Triage Vitals  Enc Vitals Group     BP 07/18/21 1214 129/82     Pulse Rate 07/18/21 1214 (!) 101     Resp 07/18/21 1214 18     Temp 07/18/21 1214 97.9 F (36.6 C)     Temp src --      SpO2 07/18/21 1214 98 %     Weight --      Height --      Head Circumference --      Peak Flow --      Pain Score 07/18/21 1216 0      Pain Loc --      Pain Edu? --      Excl. in [place]? --    No data found.  Updated Vital Signs BP 129/82   Pulse (!) 101   Temp 97.9 F (36.6 C)   Resp 18   SpO2 98%   Visual Acuity Right Eye Distance:   Left Eye Distance:   Bilateral Distance:    Right Eye Near:   Left Eye Near:    Bilateral Near:     Physical Exam Constitutional:      General: She is not in acute distress.    Appearance: Normal appearance. She is not toxic-appearing or diaphoretic.  HENT:     Head: Normocephalic and atraumatic.     Mouth/Throat:     Mouth: Mucous membranes are moist.     Pharynx: No posterior oropharyngeal erythema.  Eyes:     Extraocular Movements: Extraocular movements intact.     Conjunctiva/sclera: Conjunctivae normal.  Cardiovascular:     Rate and Rhythm: Normal rate and regular rhythm.     Pulses: Normal pulses.     Heart sounds: Normal heart sounds.  Pulmonary:     Effort: Pulmonary effort is normal. No respiratory distress.     Breath sounds: Normal breath sounds.  Abdominal:     General: Abdomen is flat. Bowel sounds are normal. There is no distension.     Palpations: Abdomen is soft.     Tenderness: There is no abdominal tenderness.  Neurological:     General: No focal deficit present.     Mental Status: She is alert and oriented to person, place, and time. Mental status is at baseline.  Psychiatric:        Mood and Affect: Mood normal.        Behavior: Behavior normal.        Thought Content: Thought content normal.        Judgment: Judgment normal.      UC Treatments / Results  Labs (all labs ordered are listed, but only abnormal results are displayed) Labs Reviewed  COMPREHENSIVE METABOLIC PANEL  CBC    EKG   Radiology No results found.  Procedures Procedures (including critical care time)  Medications Ordered in UC Medications - No data to display  Initial Impression / Assessment and Plan / UC Course  I have reviewed the  triage vital signs and  the nursing notes.  Pertinent labs & imaging results that were available during my care of the patient were reviewed by me and considered in my medical decision making (see chart for details).     Unsure exact etiology of patient's symptoms but I do think it requires further evaluation and management given persistent symptoms.  CMP and CBC pending.  Patient sent home with stool sample kit to bring back stool sample for further testing.  I do think the patient benefit from seeing a gastroenterologist as well given persistent symptoms and she was provided with contact information to follow-up on Monday.  No signs of dehydration on exam and no serious abdominal pain or blood in the stools so do not think that emergent evaluation at the hospital is necessary at this time.  Although, patient was given strict return and ER precautions.  Patient advised to ensure adequate fluid hydration.  Patient advised of bland diet.  Patient verbalized understanding and was agreeable with plan. Final Clinical Impressions(s) / UC Diagnoses   Final diagnoses:  Nausea vomiting and diarrhea     Discharge Instructions      Lab work is pending.  We will call you if there are any abnormalities.  You were given a stool sample kit to bring back your stool sample to be tested as well.  Please be bring this back at your earliest convenience.  Please follow-up with gastrointestinal doctor as well on Monday for further evaluation and management.    ED Prescriptions   None    PDMP not reviewed this encounter.   Teodora Medici, [place] 07/18/21 1259

## 2021-07-19 DIAGNOSIS — R079 Chest pain, unspecified: Secondary | ICD-10-CM | POA: Diagnosis not present

## 2021-07-19 DIAGNOSIS — R0789 Other chest pain: Secondary | ICD-10-CM | POA: Diagnosis not present

## 2021-07-19 DIAGNOSIS — R112 Nausea with vomiting, unspecified: Secondary | ICD-10-CM | POA: Diagnosis not present

## 2021-07-19 DIAGNOSIS — R82998 Other abnormal findings in urine: Secondary | ICD-10-CM | POA: Diagnosis not present

## 2021-07-19 DIAGNOSIS — R197 Diarrhea, unspecified: Secondary | ICD-10-CM | POA: Diagnosis not present

## 2021-07-19 LAB — COMPREHENSIVE METABOLIC PANEL
ALT: 6 IU/L (ref 0–32)
AST: 12 IU/L (ref 0–40)
Albumin/Globulin Ratio: 1.4 (ref 1.2–2.2)
Albumin: 4.4 g/dL (ref 3.7–4.7)
Alkaline Phosphatase: 96 IU/L (ref 44–121)
BUN/Creatinine Ratio: 21 (ref 12–28)
BUN: 28 mg/dL — ABNORMAL HIGH (ref 8–27)
Bilirubin Total: 0.4 mg/dL (ref 0.0–1.2)
CO2: 18 mmol/L — ABNORMAL LOW (ref 20–29)
Calcium: 10 mg/dL (ref 8.7–10.3)
Chloride: 96 mmol/L (ref 96–106)
Creatinine, Ser: 1.31 mg/dL — ABNORMAL HIGH (ref 0.57–1.00)
Globulin, Total: 3.2 g/dL (ref 1.5–4.5)
Glucose: 68 mg/dL — ABNORMAL LOW (ref 70–99)
Potassium: 4.1 mmol/L (ref 3.5–5.2)
Sodium: 136 mmol/L (ref 134–144)
Total Protein: 7.6 g/dL (ref 6.0–8.5)
eGFR: 43 mL/min/{1.73_m2} — ABNORMAL LOW (ref >59)

## 2021-07-19 LAB — CBC
Hematocrit: 35.5 % (ref 34.0–46.6)
Hemoglobin: 11.4 g/dL (ref 11.1–15.9)
MCH: 27.1 pg (ref 26.6–33.0)
MCHC: 32.1 g/dL (ref 31.5–35.7)
MCV: 84 fL (ref 79–97)
Platelets: 329 10*3/uL (ref 150–450)
RBC: 4.21 x10E6/uL (ref 3.77–5.28)
RDW: 14.5 % (ref 11.7–15.4)
WBC: 13.9 10*3/uL — ABNORMAL HIGH (ref 3.4–10.8)

## 2021-07-20 DIAGNOSIS — R0789 Other chest pain: Secondary | ICD-10-CM | POA: Diagnosis not present

## 2021-07-20 DIAGNOSIS — R000 Tachycardia, unspecified: Secondary | ICD-10-CM | POA: Diagnosis not present

## 2021-07-20 DIAGNOSIS — R112 Nausea with vomiting, unspecified: Secondary | ICD-10-CM | POA: Diagnosis not present

## 2021-07-20 DIAGNOSIS — R079 Chest pain, unspecified: Secondary | ICD-10-CM | POA: Diagnosis not present

## 2021-07-23 ENCOUNTER — Telehealth: Payer: Self-pay | Admitting: Internal Medicine

## 2021-07-24 ENCOUNTER — Encounter: Payer: Self-pay | Admitting: Gastroenterology

## 2021-07-28 ENCOUNTER — Other Ambulatory Visit (INDEPENDENT_AMBULATORY_CARE_PROVIDER_SITE_OTHER): Payer: Medicare Other

## 2021-07-28 ENCOUNTER — Ambulatory Visit (INDEPENDENT_AMBULATORY_CARE_PROVIDER_SITE_OTHER): Payer: Medicare Other | Admitting: Gastroenterology

## 2021-07-28 VITALS — BP 124/66 | HR 88 | Ht 66.0 in | Wt 157.8 lb

## 2021-07-28 DIAGNOSIS — A040 Enteropathogenic Escherichia coli infection: Secondary | ICD-10-CM | POA: Diagnosis not present

## 2021-07-28 DIAGNOSIS — A09 Infectious gastroenteritis and colitis, unspecified: Secondary | ICD-10-CM | POA: Diagnosis not present

## 2021-07-28 DIAGNOSIS — D72829 Elevated white blood cell count, unspecified: Secondary | ICD-10-CM | POA: Diagnosis not present

## 2021-07-28 LAB — CBC WITH DIFFERENTIAL/PLATELET
Basophils Absolute: 0.1 10*3/uL (ref 0.0–0.1)
Basophils Relative: 1.5 % (ref 0.0–3.0)
Eosinophils Absolute: 1.4 10*3/uL — ABNORMAL HIGH (ref 0.0–0.7)
Eosinophils Relative: 18.8 % — ABNORMAL HIGH (ref 0.0–5.0)
HCT: 31.6 % — ABNORMAL LOW (ref 36.0–46.0)
Hemoglobin: 10.4 g/dL — ABNORMAL LOW (ref 12.0–15.0)
Lymphocytes Relative: 37.2 % (ref 12.0–46.0)
Lymphs Abs: 2.7 10*3/uL (ref 0.7–4.0)
MCHC: 32.8 g/dL (ref 30.0–36.0)
MCV: 81.7 fl (ref 78.0–100.0)
Monocytes Absolute: 0.5 10*3/uL (ref 0.1–1.0)
Monocytes Relative: 6.8 % (ref 3.0–12.0)
Neutro Abs: 2.6 10*3/uL (ref 1.4–7.7)
Neutrophils Relative %: 35.7 % — ABNORMAL LOW (ref 43.0–77.0)
Platelets: 243 10*3/uL (ref 150.0–400.0)
RBC: 3.87 Mil/uL (ref 3.87–5.11)
RDW: 15.2 % (ref 11.5–15.5)
WBC: 7.2 10*3/uL (ref 4.0–10.5)

## 2021-07-28 LAB — BASIC METABOLIC PANEL
BUN: 11 mg/dL (ref 6–23)
CO2: 28 mEq/L (ref 19–32)
Calcium: 9.5 mg/dL (ref 8.4–10.5)
Chloride: 103 mEq/L (ref 96–112)
Creatinine, Ser: 0.87 mg/dL (ref 0.40–1.20)
GFR: 65.95 mL/min (ref >60.00)
Glucose, Bld: 95 mg/dL (ref 70–99)
Potassium: 3.5 mEq/L (ref 3.5–5.1)
Sodium: 137 mEq/L (ref 135–145)

## 2021-07-28 NOTE — Progress Notes (Signed)
I agree with the above note, plan 

## 2021-07-28 NOTE — Progress Notes (Signed)
07/28/2021 Alexandra Flores [id number] 16-Mar-1945   HISTORY OF PRESENT ILLNESS: This is a 76 year old female who is new to our office.  She is here for ER follow-up of her diarrhea.  She had sudden onset diarrhea with nausea and vomiting.  Was seen in the ER at [facility] on 7/2-7/3.  Had elevated white blood cell count to 18K and a mild bump in her creatinine.  Stool for C. difficile was negative, but GI pathogen panel was positive for enteropathogenic E. coli.  She was given IV hydration, etc.  Symptoms resolved on their own, no antibiotics needed.  She is feeling much better.  Normal bowel movements for the past 3 days.  Her appetite is good.  Energy is slowly returning.   She has never had colonoscopy in the past.  She has had a couple of Cologuards performed previously that she says were negative.  There was an order for one placed for May 2023 by her PCP and she says that she has the kit at home, but just has not performed that yet.   Past Medical History:  Diagnosis Date   Anxiety    Eczema    GERD (gastroesophageal reflux disease)    Hyperlipemia 09/30/2015   Hypertension    Tobacco use 02/10/2012   Past Surgical History:  Procedure Laterality Date   BREAST SURGERY     benign cyst removal    reports that she has been smoking cigarettes. She has a 48.00 pack-year smoking history. She has never used smokeless tobacco. She reports current alcohol use. She reports that she does not use drugs. family history includes Arthritis in her father; Asthma in her sister; Cancer (age of onset: 34) in her sister; Eczema in her father; HIV/AIDS in her brother; Hypertension in her mother; Stroke (age of onset: 83) in her mother. No Known Allergies    Outpatient Encounter Medications as of 07/28/2021  Medication Sig   amLODipine (NORVASC) 10 MG tablet TAKE 1 TABLET BY MOUTH EVERY DAY   atorvastatin (LIPITOR) 20 MG tablet Take 1 tablet (20 mg total) by mouth daily.   calcium carbonate (TUMS -  DOSED IN MG ELEMENTAL CALCIUM) 500 MG chewable tablet Chew 1 tablet by mouth as needed for indigestion or heartburn.   hydrochlorothiazide (HYDRODIURIL) 50 MG tablet Take 1 tablet (50 mg total) by mouth daily.   lisinopril (ZESTRIL) 40 MG tablet Take 1 tablet (40 mg total) by mouth daily.   LORazepam (ATIVAN) 0.5 MG tablet Take 1 tablet (0.5 mg total) by mouth 2 (two) times daily as needed for anxiety.   ondansetron (ZOFRAN-ODT) 4 MG disintegrating tablet Take 4 mg by mouth every 6 (six) hours.   prochlorperazine (COMPAZINE) 5 MG tablet Take 5 mg by mouth every 6 (six) hours as needed.   meloxicam (MOBIC) 7.5 MG tablet Take 1 tablet (7.5 mg total) by mouth daily. (Patient not taking: Reported on 07/28/2021)   Multiple Vitamins-Minerals (MULTIVITAMIN ADULTS PO) Take by mouth. (Patient not taking: Reported on 07/28/2021)   triamcinolone cream (KENALOG) 0.1 % Apply 1 application. topically 2 (two) times daily. (Patient taking differently: Apply 1 application  topically 2 (two) times daily as needed.)   [DISCONTINUED] ondansetron (ZOFRAN) 4 MG tablet Take 1 tablet (4 mg total) by mouth 2 (two) times daily. (Patient not taking: Reported on 07/28/2021)   No facility-administered encounter medications on file as of 07/28/2021.     REVIEW OF SYSTEMS  : All other systems reviewed and negative except where  noted in the History of Present Illness.   PHYSICAL EXAM: BP 124/66   Pulse 88   Ht '5\' 6"'  (1.676 m)   Wt 157 lb 12.8 oz (71.6 kg)   SpO2 98%   BMI 25.47 kg/m  General: Well developed female in no acute distress Head: Normocephalic and atraumatic Eyes:  Sclerae anicteric, conjunctiva pink. Ears: Normal auditory acuity Lungs: Clear throughout to auscultation; no W/R/R. Heart: Regular rate and rhythm; no M/R/G. Abdomen: Soft, non-distended.  BS present.  Non-tender. Musculoskeletal: Symmetrical with no gross deformities  Skin: No lesions on visible extremities Extremities: No edema   Neurological: Alert oriented x 4, grossly non-focal Psychological:  Alert and cooperative. Normal mood and affect  ASSESSMENT AND PLAN: *Infectious diarrhea due to enteropathogenic Ecoli:  Seen on GI pathogen panel 7/3.  Symptoms were sudden in onset.  Resolved without intervention.  Now with normal bowel movements for the last 3 days, feeling much better. *Leukocytosis and AKI: Had an elevated white blood cell count up to 18 and mild bump in her Cr during her evaluation for the diarrhea.  We will check CBC and CMP today. *Colorectal cancer screening: Never had colonoscopy in the past.  Has performed a few Cologuards in the past.  Had one ordered again recently, but has not yet performed it.  She will plan to do that in the next month or 2.  CC:  No ref. provider found

## 2021-07-28 NOTE — Patient Instructions (Signed)
If you are age 74 or older, your body mass index should be between 23-30. Your Body mass index is 25.47 kg/m. If this is out of the aforementioned range listed, please consider follow up with your Primary Care Provider. ________________________________________________________  The [facility] providers would like to encourage you to use [facility] to communicate with providers for non-urgent requests or questions.  Due to long hold times on the telephone, sending your provider a message by [facility] may be a faster and more efficient way to get a response.  Please allow 48 business hours for a response.  Please remember that this is for non-urgent requests.  _______________________________________________________  Your provider has requested that you go to the basement level for lab work before leaving today. Press "B" on the elevator. The lab is located at the first door on the left as you exit the elevator.  Please be sure to complete the Cologuard test that your Primary Care ordered for you at your convenience.  Follow up as needed.  Thank you for entrusting me with your care and choosing [facility].  Alonza Bogus, PA-C

## 2021-08-03 ENCOUNTER — Other Ambulatory Visit: Payer: Self-pay | Admitting: *Deleted

## 2021-08-03 DIAGNOSIS — M25512 Pain in left shoulder: Secondary | ICD-10-CM

## 2021-08-03 MED ORDER — MELOXICAM 7.5 MG PO TABS: 7.5000 mg | ORAL_TABLET | Freq: Every day | ORAL | 0 refills | Status: DC

## 2021-08-21 ENCOUNTER — Encounter: Payer: Self-pay | Admitting: Family Medicine

## 2021-08-21 ENCOUNTER — Ambulatory Visit (INDEPENDENT_AMBULATORY_CARE_PROVIDER_SITE_OTHER): Payer: Medicare Other | Admitting: Family Medicine

## 2021-08-21 VITALS — BP 118/78 | HR 80 | Temp 97.6°F | Ht 66.0 in | Wt 160.9 lb

## 2021-08-21 DIAGNOSIS — R7303 Prediabetes: Secondary | ICD-10-CM | POA: Diagnosis not present

## 2021-08-21 DIAGNOSIS — I10 Essential (primary) hypertension: Secondary | ICD-10-CM

## 2021-08-21 DIAGNOSIS — A040 Enteropathogenic Escherichia coli infection: Secondary | ICD-10-CM

## 2021-08-21 NOTE — Progress Notes (Signed)
Established Patient Office Visit  Subjective   Patient ID: Alexandra Flores, female    DOB: September 01, 1945  Age: 76 y.o. MRN: [id number]  Chief Complaint  Patient presents with   Establish Care    Patient is here for follow up. State she had E. Coli and when she went to the GI doctor. States her blood pressure was low and she was told to see her primary care doctor. I reviewed her ER visit note nad the note from the GI doctor. No dizziness, no presyncopal symptoms. States her diarhea and vomiting resolved. BP today in the office is 118/70 which is in the normal range.        Review of Systems  All other systems reviewed and are negative.     Objective:     BP 118/78 (BP Location: Left Arm, Patient Position: Sitting, Cuff Size: Large)   Pulse 80   Temp 97.6 F (36.4 C) (Oral)   Ht '5\' 6"'$  (1.676 m)   Wt 160 lb 14.4 oz (73 kg)   SpO2 99%   BMI 25.97 kg/m     Physical Exam Constitutional:      Appearance: Normal appearance. She is normal weight.  Eyes:     Extraocular Movements: Extraocular movements intact.     Pupils: Pupils are equal, round, and reactive to light.  Cardiovascular:     Rate and Rhythm: Normal rate and regular rhythm.     Heart sounds: Normal heart sounds. No murmur heard. Pulmonary:     Effort: Pulmonary effort is normal.     Breath sounds: Normal breath sounds.  Abdominal:     General: Abdomen is flat. Bowel sounds are normal.     Palpations: Abdomen is soft.  Neurological:     Mental Status: She is alert.     CBC    Component Value Date/Time   WBC 7.2 07/28/2021 1054   RBC 3.87 07/28/2021 1054   HGB 10.4 (L) 07/28/2021 1054   HGB 11.4 07/18/2021 1249   HCT 31.6 (L) 07/28/2021 1054   HCT 35.5 07/18/2021 1249   PLT 243.0 07/28/2021 1054   PLT 329 07/18/2021 1249   MCV 81.7 07/28/2021 1054   MCV 84 07/18/2021 1249   MCH 27.1 07/18/2021 1249   MCH 27.9 07/04/2015 1133   MCHC 32.8 07/28/2021 1054   RDW 15.2 07/28/2021 1054   RDW 14.5  07/18/2021 1249   LYMPHSABS 2.7 07/28/2021 1054   MONOABS 0.5 07/28/2021 1054   EOSABS 1.4 (H) 07/28/2021 1054   BASOSABS 0.1 07/28/2021 1054    No results found for any visits on 08/21/21.     The ASCVD Risk score (Arnett DK, et al., 2019) failed to calculate for the following reasons:   The valid total cholesterol range is 130 to 320 mg/dL    Assessment & Plan:   Problem List Items Addressed This Visit       Cardiovascular and Mediastinum   Essential hypertension - Primary    Pt had reports of low BP during her GI illness last month, she reports that her BP has since stablilized. Today's BP in office is WNL. I reviewed the medications listed below and she will continue these medications as prescribed. Current hypertension medications:       Sig   amLODipine (NORVASC) 10 MG tablet (Taking) TAKE 1 TABLET BY MOUTH EVERY DAY   hydrochlorothiazide (HYDRODIURIL) 50 MG tablet (Taking) Take 1 tablet (50 mg total) by mouth daily.   lisinopril (ZESTRIL) 40  MG tablet (Taking) Take 1 tablet (40 mg total) by mouth daily.         Digestive   Intestinal infection due to enteropathogenic E. coli    Symptoms resolved at this time. Her labs showed an eosinophilia and an anemia at 10.4. this could have been due to the infection. I will recheck her CBC at the next visit for surveillance. Her hypotension was likely due to intravascular volume depletion from the GI illness which is apparently resolved.         Other   Prediabetes (Chronic)    Last set of labs were reviewed with the patient. We discussed her elevated blood sugars and I advised that she reduce sugar and starches in her diet (pt was not following a specific diet before this visit). She will return to the office in 3 months for a follow up and to get her annual labs.       Return in about 3 months (around 11/21/2021) for a1c recheck and BP follow up.    Farrel Conners, MD

## 2021-08-21 NOTE — Assessment & Plan Note (Signed)
Pt had reports of low BP during her GI illness last month, she reports that her BP has since stablilized. Today's BP in office is WNL. I reviewed the medications listed below and she will continue these medications as prescribed. Current hypertension medications:      Sig   amLODipine (NORVASC) 10 MG tablet (Taking) TAKE 1 TABLET BY MOUTH EVERY DAY   hydrochlorothiazide (HYDRODIURIL) 50 MG tablet (Taking) Take 1 tablet (50 mg total) by mouth daily.   lisinopril (ZESTRIL) 40 MG tablet (Taking) Take 1 tablet (40 mg total) by mouth daily.

## 2021-08-21 NOTE — Assessment & Plan Note (Addendum)
Symptoms resolved at this time. Her labs showed an eosinophilia and an anemia at 10.4. this could have been due to the infection. I will recheck her CBC at the next visit for surveillance. Her hypotension was likely due to intravascular volume depletion from the GI illness which is apparently resolved.

## 2021-08-21 NOTE — Patient Instructions (Signed)
Try to look for ways to reduce sugar and starches in your diet.   Stay well hydrated.

## 2021-08-21 NOTE — Assessment & Plan Note (Signed)
Last set of labs were reviewed with the patient. We discussed her elevated blood sugars and I advised that she reduce sugar and starches in her diet (pt was not following a specific diet before this visit). She will return to the office in 3 months for a follow up and to get her annual labs.

## 2021-08-31 ENCOUNTER — Telehealth: Payer: Self-pay | Admitting: *Deleted

## 2021-08-31 NOTE — Telephone Encounter (Signed)
Spoke with the patient as below.  Patient stated she "just does not want to complete the Cologuard test and does not wish to give a specific reason".  Patient was advised to contact the number on her kit for return.  Message sent to PCP.

## 2021-08-31 NOTE — Telephone Encounter (Signed)
-----   Message from Farrel Conners, MD sent at 08/31/2021  9:10 AM EDT ----- Looks like the patient hasn't done her Cologuard yet-- do you normally call her or is there a letter sent? Thanks! Dr. Jerilynn Mages ----- Message ----- From: Eulas Post, MD Sent: 08/23/2021   1:38 PM EDT To: Farrel Conners, MD   ----- Message ----- From: SYSTEM Sent: 08/23/2021  12:11 AM EDT To: [facility]

## 2021-11-23 ENCOUNTER — Encounter: Payer: Self-pay | Admitting: Family Medicine

## 2021-11-23 ENCOUNTER — Ambulatory Visit (INDEPENDENT_AMBULATORY_CARE_PROVIDER_SITE_OTHER): Payer: Medicare Other | Admitting: Family Medicine

## 2021-11-23 VITALS — BP 112/80 | HR 77 | Ht 66.0 in | Wt 161.4 lb

## 2021-11-23 DIAGNOSIS — D6489 Other specified anemias: Secondary | ICD-10-CM | POA: Diagnosis not present

## 2021-11-23 DIAGNOSIS — R7303 Prediabetes: Secondary | ICD-10-CM | POA: Diagnosis not present

## 2021-11-23 LAB — CBC WITH DIFFERENTIAL/PLATELET
Basophils Absolute: 0 10*3/uL (ref 0.0–0.1)
Basophils Relative: 0.6 % (ref 0.0–3.0)
Eosinophils Absolute: 0.2 10*3/uL (ref 0.0–0.7)
Eosinophils Relative: 3 % (ref 0.0–5.0)
HCT: 32.7 % — ABNORMAL LOW (ref 36.0–46.0)
Hemoglobin: 10.7 g/dL — ABNORMAL LOW (ref 12.0–15.0)
Lymphocytes Relative: 37.3 % (ref 12.0–46.0)
Lymphs Abs: 2.2 10*3/uL (ref 0.7–4.0)
MCHC: 32.7 g/dL (ref 30.0–36.0)
MCV: 82.1 fl (ref 78.0–100.0)
Monocytes Absolute: 0.5 10*3/uL (ref 0.1–1.0)
Monocytes Relative: 7.9 % (ref 3.0–12.0)
Neutro Abs: 3 10*3/uL (ref 1.4–7.7)
Neutrophils Relative %: 51.2 % (ref 43.0–77.0)
Platelets: 317 10*3/uL (ref 150.0–400.0)
RBC: 3.98 Mil/uL (ref 3.87–5.11)
RDW: 14.3 % (ref 11.5–15.5)
WBC: 5.8 10*3/uL (ref 4.0–10.5)

## 2021-11-23 LAB — POCT GLYCOSYLATED HEMOGLOBIN (HGB A1C): Hemoglobin A1C: 5.7 % — AB (ref 4.0–5.6)

## 2021-11-23 NOTE — Patient Instructions (Addendum)
OK to reduce your amlodipine to 5 mg  (1/2)  tablet daily-- Check your blood pressure daily at home- if your blood pressure is still at goal (less than 140/90) then it's ok to stay at the 5 mg daily  OK to reduce your hydrochlorothiazide to 25 mg daily.

## 2021-11-23 NOTE — Progress Notes (Unsigned)
Established Patient Office Visit  Subjective   Patient ID: AZALIA NEUBERGER, female    DOB: 1945-07-24  Age: 76 y.o. MRN: [id number]  Chief Complaint  Patient presents with   Follow-up    Patient is here for follow up on her A1C. Patient states that she isn't eating as much as before-- her A1C today is better, is trying to reduce sugar and starches in her diet also. No new symptoms or issues to report.    Current Outpatient Medications  Medication Instructions   amLODipine (NORVASC) 10 MG tablet TAKE 1 TABLET BY MOUTH EVERY DAY   atorvastatin (LIPITOR) 20 mg, Oral, Daily   calcium carbonate (TUMS - DOSED IN MG ELEMENTAL CALCIUM) 500 MG chewable tablet 1 tablet, Oral, As needed   hydrochlorothiazide (HYDRODIURIL) 50 mg, Oral, Daily   lisinopril (ZESTRIL) 40 mg, Oral, Daily   LORazepam (ATIVAN) 0.5 mg, Oral, 2 times daily PRN   meloxicam (MOBIC) 7.5 mg, Oral, Daily   Multiple Vitamins-Minerals (MULTIVITAMIN ADULTS PO) Oral   prochlorperazine (COMPAZINE) 5 mg, Oral, Every 6 hours PRN   triamcinolone cream (KENALOG) 0.1 % 1 application , Topical, 2 times daily    Patient Active Problem List   Diagnosis Date Noted   Prediabetes 08/21/2021   Intestinal infection due to enteropathogenic E. coli 07/28/2021   Infectious diarrhea 07/28/2021   Leukocytosis 07/28/2021   Hyperlipemia 09/30/2015   Fibroids 03/23/2012   Tobacco use 02/10/2012   KNEE PAIN 03/05/2008   Essential hypertension 03/13/2007   GERD 03/13/2007      Review of Systems  All other systems reviewed and are negative.     Objective:     BP 112/80 (BP Location: Left Arm, Patient Position: Sitting, Cuff Size: Normal)   Pulse 77   Ht '5\' 6"'$  (1.676 m)   Wt 161 lb 6.4 oz (73.2 kg)   SpO2 98%   BMI 26.05 kg/m    Physical Exam Vitals reviewed.  Constitutional:      Appearance: Normal appearance. She is well-groomed and normal weight.  Eyes:     Conjunctiva/sclera: Conjunctivae normal.  Neck:     Thyroid:  No thyromegaly.  Cardiovascular:     Rate and Rhythm: Normal rate and regular rhythm.     Pulses: Normal pulses.     Heart sounds: S1 normal and S2 normal.  Pulmonary:     Effort: Pulmonary effort is normal.     Breath sounds: Normal breath sounds and air entry.  Abdominal:     General: Bowel sounds are normal.  Musculoskeletal:     Right lower leg: No edema.     Left lower leg: No edema.  Neurological:     Mental Status: She is alert and oriented to person, place, and time. Mental status is at baseline.     Gait: Gait is intact.  Psychiatric:        Mood and Affect: Mood and affect normal.        Speech: Speech normal.        Behavior: Behavior normal.        Judgment: Judgment normal.     Last hemoglobin A1c Lab Results  Component Value Date   HGBA1C 5.7 (A) 11/23/2021      The ASCVD Risk score (Arnett DK, et al., 2019) failed to calculate for the following reasons:   The valid total cholesterol range is 130 to 320 mg/dL    Assessment & Plan:   Problem List Items Addressed This Visit  Other   Prediabetes - Primary (Chronic)    A1C is improved since last check. Will continue to monitor this every 6 months.       Relevant Orders   POC HgB A1c (Completed)   Other Visit Diagnoses     Anemia due to other cause, not classified       Relevant Orders   Patient's labs were reviewed, she has a history of anemia in the past, will need a new CBC for surveillance of this problem.  CBC with Differential/Platelets (Completed)       Return in about 6 months (around 05/27/2022) for Follow up - will need annual bloodwork done.    Farrel Conners, MD

## 2021-11-25 NOTE — Assessment & Plan Note (Signed)
A1C is improved since last check. Will continue to monitor this every 6 months.

## 2021-12-31 ENCOUNTER — Telehealth: Payer: Self-pay

## 2021-12-31 NOTE — Telephone Encounter (Signed)
---  Caller states she was picking up large size of laundry detergent and back started hurting last Friday. has taken Tylenol and pain gets better. has pain in lower back. pain level gets better when she sits and gets worse when she gets up and moves around.  12/30/2021 11:06:28 AM SEE PCP WITHIN 3 DAYS Raphael Gibney, RN, Alexandra Flores  Comments User: Dannielle Burn, RN Date/Time Alexandra Flores Time): 12/30/2021 11:05:52 AM pt does not want to make appt. please call pt back regarding getting some muscle relaxers  12/31/21 at 1242 - Pt states her back is not hurting like it was; has not taken any OTC. Pt wants medication however she has not taken a muscle relaxer "in years" & it is not currently on her medication list. Pt advised to schedule in person appt. Appt scheduled with PCP on 01/01/22.

## 2022-01-01 ENCOUNTER — Ambulatory Visit (INDEPENDENT_AMBULATORY_CARE_PROVIDER_SITE_OTHER): Payer: Medicare Other | Admitting: Family Medicine

## 2022-01-01 ENCOUNTER — Encounter: Payer: Self-pay | Admitting: Family Medicine

## 2022-01-01 DIAGNOSIS — M545 Low back pain, unspecified: Secondary | ICD-10-CM

## 2022-01-01 MED ORDER — CYCLOBENZAPRINE HCL 10 MG PO TABS: 5.0000 mg | ORAL_TABLET | Freq: Every day | ORAL | 0 refills | Status: DC

## 2022-01-01 MED ORDER — MELOXICAM 7.5 MG PO TABS: 7.5000 mg | ORAL_TABLET | Freq: Every day | ORAL | 2 refills | Status: DC

## 2022-01-01 NOTE — Progress Notes (Signed)
Established Patient Office Visit  Subjective   Patient ID: Alexandra Flores, female    DOB: 03/18/1945  Age: 76 y.o. MRN: [id number]  Chief Complaint  Patient presents with   Back Pain    Symptoms started a week ago  Sx worsen so pt started taking Tylenol  Walking makes it hurt more sitting helps     States the pain started after she lifted a large container of detergent.  Back Pain This is a new problem. The current episode started in the past 7 days. The problem occurs intermittently. The pain is present in the lumbar spine. The quality of the pain is described as stabbing. The pain does not radiate. The symptoms are aggravated by bending, twisting and standing. Stiffness is present All day.   Current Outpatient Medications  Medication Instructions   amLODipine (NORVASC) 10 MG tablet TAKE 1 TABLET BY MOUTH EVERY DAY   atorvastatin (LIPITOR) 20 mg, Oral, Daily   calcium carbonate (TUMS - DOSED IN MG ELEMENTAL CALCIUM) 500 MG chewable tablet 1 tablet, Oral, As needed   cyclobenzaprine (FLEXERIL) 5-10 mg, Oral, Daily at bedtime   hydrochlorothiazide (HYDRODIURIL) 50 mg, Oral, Daily   lisinopril (ZESTRIL) 40 mg, Oral, Daily   LORazepam (ATIVAN) 0.5 mg, Oral, 2 times daily PRN   meloxicam (MOBIC) 7.5 mg, Oral, Daily   Multiple Vitamins-Minerals (MULTIVITAMIN ADULTS PO) Oral   prochlorperazine (COMPAZINE) 5 mg, Oral, Every 6 hours PRN   triamcinolone cream (KENALOG) 0.1 % 1 application , Topical, 2 times daily    Patient Active Problem List   Diagnosis Date Noted   Prediabetes 08/21/2021   Intestinal infection due to enteropathogenic E. coli 07/28/2021   Infectious diarrhea 07/28/2021   Leukocytosis 07/28/2021   Hyperlipemia 09/30/2015   Fibroids 03/23/2012   Tobacco use 02/10/2012   KNEE PAIN 03/05/2008   Essential hypertension 03/13/2007   GERD 03/13/2007      Review of Systems  Musculoskeletal:  Positive for back pain.      Objective:     BP (!) 150/90   Pulse  76   Temp 98.1 F (36.7 C) (Oral)   Ht '5\' 6"'$  (1.676 m)   Wt 166 lb (75.3 kg)   SpO2 99%   BMI 26.79 kg/m  BP Readings from Last 3 Encounters:  01/01/22 (!) 150/90  11/23/21 112/80  08/21/21 118/78      Physical Exam Vitals reviewed.  Constitutional:      Appearance: Normal appearance. She is normal weight.  Pulmonary:     Effort: Pulmonary effort is normal.  Musculoskeletal:     Lumbar back: Tenderness (right side worse than left, TTP of the paraspinal muscles) present.  Neurological:     General: No focal deficit present.     Mental Status: She is alert and oriented to person, place, and time. Mental status is at baseline.  Psychiatric:        Mood and Affect: Mood normal.      No results found for any visits on 01/01/22.    The ASCVD Risk score (Arnett DK, et al., 2019) failed to calculate for the following reasons:   The valid total cholesterol range is 130 to 320 mg/dL    Assessment & Plan:   Problem List Items Addressed This Visit   None Visit Diagnoses     Acute right-sided low back pain without sciatica       Relevant Medications   meloxicam (MOBIC) 7.5 MG tablet   cyclobenzaprine (FLEXERIL) 10 MG  tablet  Will refill the patient's meloxicam 7.5 mg once daily, recommended she continue home management of her acute back pain with heating pads, muscle rubs. She may use the cyclobenzaprine 5-10 mg at bedtime to help with the stiffness. RTC if sx worsen or persist.     No follow-ups on file.    Farrel Conners, MD

## 2022-01-01 NOTE — Patient Instructions (Signed)
Heating pad, muscle rubs like icy hot, biofreeze or aspercreme

## 2022-01-25 ENCOUNTER — Other Ambulatory Visit: Payer: Self-pay | Admitting: *Deleted

## 2022-01-25 DIAGNOSIS — I10 Essential (primary) hypertension: Secondary | ICD-10-CM

## 2022-01-25 MED ORDER — HYDROCHLOROTHIAZIDE 50 MG PO TABS: 50.0000 mg | ORAL_TABLET | Freq: Every day | ORAL | 1 refills | Status: DC

## 2022-01-25 MED ORDER — LISINOPRIL 40 MG PO TABS: 40.0000 mg | ORAL_TABLET | Freq: Every day | ORAL | 1 refills | Status: DC

## 2022-03-10 ENCOUNTER — Other Ambulatory Visit: Payer: Self-pay | Admitting: Family Medicine

## 2022-03-10 DIAGNOSIS — M545 Low back pain, unspecified: Secondary | ICD-10-CM

## 2022-05-20 ENCOUNTER — Telehealth: Payer: Self-pay

## 2022-05-20 NOTE — Patient Outreach (Signed)
  Care Coordination   05/20/2022 Name: Alexandra Flores MRN: [id number] DOB: November 04, 1945   Care Coordination Outreach Attempts:  An unsuccessful telephone outreach was attempted today to offer the patient information about available care coordination services.  Follow Up Plan:  Additional outreach attempts will be made to offer the patient care coordination information and services.   Encounter Outcome:  No Answer   Care Coordination Interventions:  No, not indicated    Bevelyn Ngo, BSW, CDP Social Worker, Certified Dementia Practitioner [facility] Care Management  Care Coordination [phone]

## 2022-05-25 ENCOUNTER — Telehealth: Payer: Self-pay

## 2022-05-25 NOTE — Patient Outreach (Signed)
  Care Coordination   05/25/2022 Name: Alexandra Flores MRN: [id number] DOB: 11/01/1945   Care Coordination Outreach Attempts:  A second unsuccessful outreach was attempted today to offer the patient with information about available care coordination services.  Follow Up Plan:  Additional outreach attempts will be made to offer the patient care coordination information and services.   Encounter Outcome:  No Answer   Care Coordination Interventions:  No, not indicated    Bevelyn Ngo, BSW, CDP Social Worker, Certified Dementia Practitioner [facility] Care Management  Care Coordination [phone]

## 2022-05-27 ENCOUNTER — Telehealth: Payer: Self-pay

## 2022-05-27 NOTE — Patient Outreach (Signed)
  Care Coordination   05/27/2022 Name: Alexandra Flores MRN: [id number] DOB: [birth year]   Care Coordination Outreach Attempts:  A third unsuccessful outreach was attempted today to offer the patient with information about available care coordination services.  Follow Up Plan:  No further outreach attempts will be made at this time. We have been unable to contact the patient to offer or enroll patient in care coordination services  Encounter Outcome:  No Answer   Care Coordination Interventions:  No, not indicated    Bevelyn Ngo, BSW, CDP Social Worker, Certified Dementia Practitioner [facility] Care Management  Care Coordination [phone]

## 2022-05-31 ENCOUNTER — Encounter

## 2022-05-31 ENCOUNTER — Telehealth: Payer: Self-pay

## 2022-05-31 NOTE — Telephone Encounter (Signed)
Contacted patient on preferred number listed in notes for scheduled AWV. Patient stated unable to complete visit today, will call to reschedule.

## 2022-06-08 ENCOUNTER — Other Ambulatory Visit: Payer: Self-pay | Admitting: *Deleted

## 2022-06-08 DIAGNOSIS — I10 Essential (primary) hypertension: Secondary | ICD-10-CM

## 2022-06-08 DIAGNOSIS — E785 Hyperlipidemia, unspecified: Secondary | ICD-10-CM

## 2022-06-08 MED ORDER — ATORVASTATIN CALCIUM 20 MG PO TABS: 20.0000 mg | ORAL_TABLET | Freq: Every day | ORAL | 0 refills | Status: DC

## 2022-06-08 MED ORDER — AMLODIPINE BESYLATE 10 MG PO TABS: ORAL_TABLET | ORAL | 0 refills | Status: DC

## 2022-07-06 ENCOUNTER — Encounter: Payer: Self-pay | Admitting: Family Medicine

## 2022-07-06 ENCOUNTER — Ambulatory Visit (INDEPENDENT_AMBULATORY_CARE_PROVIDER_SITE_OTHER): Payer: Medicare Other | Admitting: Family Medicine

## 2022-07-06 DIAGNOSIS — Z0000 Encounter for general adult medical examination without abnormal findings: Secondary | ICD-10-CM

## 2022-07-06 NOTE — Progress Notes (Signed)
PATIENT CHECK-IN and HEALTH RISK ASSESSMENT QUESTIONNAIRE:  -completed by phone/video for upcoming Medicare Preventive Visit  Pre-Visit Check-in: 1)Vitals (height, wt, BP, etc) - record in vitals section for visit on day of visit 2)Review and Update Medications, Allergies PMH, Surgeries, Social history in Epic 3)Hospitalizations in the last year with date/reason? No  4)Review and Update Care Team (patient's specialists) in Epic 5) Complete PHQ9 in Epic  6) Complete Fall Screening in Epic 7)Review all Health Maintenance Due and order under PCP if not done.  8)Medicare Wellness Questionnaire: Answer theses question about your habits: Do you drink alcohol? Yes If yes, how many drinks do you have a day? Occasional, 1-2 Have you ever smoked? Yes Quit date if applicable? Still smokes  How many packs a day do/did you smoke? 1 pack per day Do you use smokeless tobacco? No Do you use an illicit drugs? No Do you exercises? Occasional; IF so, what type and how many days/minutes per week? Walking 1 day per week, 10 minutes - reports needs to exercise more, has foot pedal  Are you sexually active? No Number of partners? No Typical breakfast Pt does not really eat breakfast but might eat bacon Typical lunch Salad or sandwich Typical dinner Stuffed hamburger patty, macaroni and cheese, potato salad Typical snacks: popsicle, candy, cookies  Beverages: coffee, juice, pepsi  Answer theses question about you: Can you perform most household chores? Yes Do you find it hard to follow a conversation in a noisy room? No Do you often ask people to speak up or repeat themselves? No Do you feel that you have a problem with memory? Not really Do you balance your checkbook and or bank acounts? Yes Do you feel safe at home? Yes Last dentist visit? N/A Do you need assistance with any of the following: Please note if so, No  Driving? No but most of the time her daughter drives  Feeding yourself? No  Getting  from bed to chair? No  Getting to the toilet? No  Bathing or showering? No  Dressing yourself? No  Managing money? No  Climbing a flight of stairs  No  Preparing meals? No  Do you have Advanced Directives in place (Living Will, Healthcare Power or Attorney)? No   Last eye Exam and location? Last year, [facility]   Do you currently use prescribed or non-prescribed narcotic or opioid pain medications? No  Do you have a history or close family history of breast, ovarian, tubal or peritoneal cancer or a family member with BRCA (breast cancer susceptibility 1 and 2) gene mutations? Older sister passed of stomach cancer 10 years ago  Nurse/Assistant Credentials/time stamp: BDS 07/06/22    ----------------------------------------------------------------------------------------------------------------------------------------------------------------------------------------------------------------------   MEDICARE ANNUAL PREVENTIVE VISIT WITH PROVIDER: (Welcome to Medicare, initial annual wellness or annual wellness exam)  Virtual Visit via Phone Note  I connected with Alexandra Flores on 07/06/22 by phone and verified that I am speaking with the correct person using two identifiers.  Location patient: home Location provider:work or home office Persons participating in the virtual visit: patient, provider  Concerns and/or follow up today: no concerns - reports is doing well. Only complaint is that she and husband travel often and she gets anxious in the car and on airplanes. She has tried benzos which do not help.    See HM section in Epic for other details of completed HM.    ROS: negative for report of fevers, unintentional weight loss, vision changes, vision loss, hearing loss or change, chest pain,  sob, hemoptysis, melena, hematochezia, hematuria, falls, bleeding or bruising, thoughts of suicide or self harm, memory loss  Patient-completed extensive health risk assessment - reviewed  and discussed with the patient: See Health Risk Assessment completed with patient prior to the visit either above or in recent phone note. This was reviewed in detailed with the patient today and appropriate recommendations, orders and referrals were placed as needed per Summary below and patient instructions.   Review of Medical History: -PMH, PSH, Family History and current specialty and care providers reviewed and updated and listed below   Patient Care Team: Karie Georges, MD as PCP - General (Family Medicine) Shea Evans, MD as Consulting Physician (Obstetrics and Gynecology)   Past Medical History:  Diagnosis Date   Anxiety    Eczema    GERD (gastroesophageal reflux disease)    Hyperlipemia 09/30/2015   Hypertension    Tobacco use 02/10/2012    Past Surgical History:  Procedure Laterality Date   BREAST SURGERY     benign cyst removal    Social History   Socioeconomic History   Marital status: Married    Spouse name: Not on file   Number of children: Not on file   Years of education: Not on file   Highest education level: Not on file  Occupational History   Not on file  Tobacco Use   Smoking status: Every Day    Packs/day: 1.00    Years: 48.00    Additional pack years: 0.00    Total pack years: 48.00    Types: Cigarettes   Smokeless tobacco: Never   Tobacco comments:    per patient not even a pack a day; takes 3 puffs and puts it out   Vaping Use   Vaping Use: Never used  Substance and Sexual Activity   Alcohol use: Yes    Alcohol/week: 0.0 standard drinks of alcohol    Comment: occ    Drug use: Never   Sexual activity: Not on file  Other Topics Concern   Not on file  Social History Narrative   Work or School: none, retired, worked at [employer] for 25 years       Home Situation: lives with husband; mother passed in 2017      Spiritual Beliefs: methodist, christian      Lifestyle: does bowling every Wednesday, no regular exercise otherwise, diet  is not great - husband is active and exercises         Social Determinants of Health   Financial Resource Strain: Low Risk  (05/27/2021)   Overall Financial Resource Strain (CARDIA)    Difficulty of Paying Living Expenses: Not hard at all  Food Insecurity: No Food Insecurity (05/27/2021)   Hunger Vital Sign    Worried About Running Out of Food in the Last Year: Never true    Ran Out of Food in the Last Year: Never true  Transportation Needs: No Transportation Needs (05/27/2021)   PRAPARE - Administrator, Civil Service (Medical): No    Lack of Transportation (Non-Medical): No  Physical Activity: Inactive (05/27/2021)   Exercise Vital Sign    Days of Exercise per Week: 0 days    Minutes of Exercise per Session: 0 min  Stress: No Stress Concern Present (05/27/2021)   [employer] of Occupational Health - Occupational Stress Questionnaire    Feeling of Stress : Not at all  Social Connections: Socially Integrated (05/27/2021)   Social Connection and Isolation Panel [NHANES]  Frequency of Communication with Friends and Family: More than three times a week    Frequency of Social Gatherings with Friends and Family: More than three times a week    Attends Religious Services: More than 4 times per year    Active Member of [employer] or Organizations: Yes    Attends Engineer, structural: More than 4 times per year    Marital Status: Married  Catering manager Violence: Not At Risk (05/27/2021)   Humiliation, Afraid, Rape, and Kick questionnaire    Fear of Current or Ex-Partner: No    Emotionally Abused: No    Physically Abused: No    Sexually Abused: No    Family History  Problem Relation Age of Onset   Stroke Mother 83   Hypertension Mother    Arthritis Father    Eczema Father    Cancer Sister 13       'stomach cancer'    Asthma Sister    HIV/AIDS Brother    Colon cancer Neg Hx     Current Outpatient Medications on File Prior to Visit  Medication Sig  Dispense Refill   amLODipine (NORVASC) 10 MG tablet TAKE 1 TABLET BY MOUTH EVERY DAY 90 tablet 0   atorvastatin (LIPITOR) 20 MG tablet Take 1 tablet (20 mg total) by mouth daily. 90 tablet 0   cyclobenzaprine (FLEXERIL) 10 MG tablet TAKE 1/2 TO 1 TABLET (5-10 MG TOTAL) BY MOUTH EVERY DAY AT BEDTIME 30 tablet 0   hydrochlorothiazide (HYDRODIURIL) 50 MG tablet Take 1 tablet (50 mg total) by mouth daily. 90 tablet 1   lisinopril (ZESTRIL) 40 MG tablet Take 1 tablet (40 mg total) by mouth daily. 90 tablet 1   LORazepam (ATIVAN) 0.5 MG tablet Take 1 tablet (0.5 mg total) by mouth 2 (two) times daily as needed for anxiety. 30 tablet 0   meloxicam (MOBIC) 7.5 MG tablet Take 1 tablet (7.5 mg total) by mouth daily. 30 tablet 2   Multiple Vitamins-Minerals (MULTIVITAMIN ADULTS PO) Take by mouth.     triamcinolone cream (KENALOG) 0.1 % Apply 1 application. topically 2 (two) times daily. (Patient taking differently: Apply 1 application  topically 2 (two) times daily as needed.) 45 g 1   calcium carbonate (TUMS - DOSED IN MG ELEMENTAL CALCIUM) 500 MG chewable tablet Chew 1 tablet by mouth as needed for indigestion or heartburn. (Patient not taking: Reported on 07/06/2022)     prochlorperazine (COMPAZINE) 5 MG tablet Take 5 mg by mouth every 6 (six) hours as needed. (Patient not taking: Reported on 07/06/2022)     No current facility-administered medications on file prior to visit.    No Known Allergies     Physical Exam There were no vitals filed for this visit. Estimated body mass index is 26.79 kg/m as calculated from the following:   Height as of 01/01/22: 5\' 6"  (1.676 m).   Weight as of 01/01/22: 166 lb (75.3 kg).  EKG (optional): deferred due to virtual visit  GENERAL: alert, oriented, no acute distress detected, full vision exam deferred due to pandemic and/or virtual encounter  PSYCH/NEURO: pleasant and cooperative, no obvious depression or anxiety, speech and thought processing grossly  intact, Cognitive function grossly intact  Flowsheet Row Office Visit from 05/27/2021 in [facility]  PHQ-9 Total Score 0           07/06/2022   11:05 AM 05/27/2021    1:41 PM 05/25/2021   12:43 PM 03/21/2019  9:21 AM 03/29/2017   11:05 AM  Depression screen PHQ 2/9  Decreased Interest 0 0 0 0 0  Down, Depressed, Hopeless 0 0 0 0 0  PHQ - 2 Score 0 0 0 0 0  Altered sleeping  0 0    Tired, decreased energy  0 0    Change in appetite  0 0    Feeling bad or failure about yourself   0 0    Trouble concentrating  0 0    Moving slowly or fidgety/restless  0 0    Suicidal thoughts  0 0    PHQ-9 Score  0 0         05/21/2020    1:52 PM 11/08/2020    9:15 AM 05/27/2021    1:45 PM 07/18/2021   12:16 PM 07/06/2022   11:05 AM  Fall Risk  Falls in the past year? 0  0  0  Was there an injury with Fall? 0  0  0  Fall Risk Category Calculator 0  0  0  Fall Risk Category (Retired) Low  Low    (RETIRED) Patient Fall Risk Level  Low fall risk Low fall risk Low fall risk   Patient at Risk for Falls Due to Impaired vision  No Fall Risks  No Fall Risks  Fall risk Follow up Falls prevention discussed    Falls evaluation completed     SUMMARY AND PLAN:  Encounter for Medicare annual wellness exam    Discussed applicable health maintenance/preventive health measures and advised and referred or ordered per patient preferences: -she refused each of the measures, advised to contact us if she changes he mind -she was not aware covid vaccines were due - advised and let her know she could get at the pharmacy if decides to get  Health Maintenance  Topic Date Due   COVID-19 Vaccine (4 - 2023-24 season) 07/22/2022 (Originally 09/18/2021)   Zoster Vaccines- Shingrix (1 of 2) 10/06/2022 (Originally 11/05/1997)   Lung Cancer Screening  07/06/2023 (Originally 11/05/1997)   Pneumonia Vaccine 60+ Years old (1 of 2 - PCV) 07/06/2023 (Originally 11/05/1951)   MAMMOGRAM   07/06/2023 (Originally 04/27/2014)   DEXA SCAN  07/06/2023 (Originally 11/05/2012)   INFLUENZA VACCINE  08/19/2022   Medicare Annual Wellness (AWV)  07/06/2023   Colonoscopy  09/28/2023   Hepatitis C Screening  Completed   HPV VACCINES  Aged Out   DTaP/Tdap/Td  Discontinued   Education and counseling on the following was provided based on the above review of health and a plan/checklist for the patient, along with additional information discussed, was provided for the patient in the patient instructions :  -Advised on importance of completing advanced directives, discussed options for completing and provided information in patient instructions as well -Advised and counseled on a healthy lifestyle - including the importance of a healthy diet, regular physical activity, social connections and stress management. -Reviewed patient's current diet. Advised and counseled on a whole foods based healthy diet. A summary of a healthy diet was provided in the Patient Instructions.  -reviewed patient's current physical activity level and discussed exercise guidelines for adults. Discussed community resources and ideas for safe exercise at home to assist in meeting exercise guideline recommendations in a safe and healthy way.  -Advise yearly dental visits at minimum and regular eye exams -Advised and counseled on alcohol safe limits, risks/ tobacco use - she is in pre contemplation stage.  -discussed options for managing anxiety on trips - headphones  with book or podcast, eyemask, etc. She wants to try this. Advised if not working could do therapy or see pcp to discuss other options. Discussed risks with medications for this. Follow up: see patient instructions     Patient Instructions  I really enjoyed getting to talk with you today! I am available on Tuesdays and Thursdays for virtual visits if you have any questions or concerns, or if I can be of any further assistance.   CHECKLIST FROM ANNUAL WELLNESS  VISIT:  -Follow up (please call to schedule if not scheduled after visit):   -yearly for annual wellness visit with primary care office  Here is a list of your preventive care/health maintenance measures and the plan for each if any are due:  PLAN For any measures below that may be due:   Health Maintenance  Topic Date Due   COVID-19 Vaccine (4 - 2023-24 season) 07/22/2022 (Originally 09/18/2021)   Zoster Vaccines- Shingrix (1 of 2) 10/06/2022 (Originally 11/05/1997)   Lung Cancer Screening  07/06/2023 (Originally 11/05/1997)   Pneumonia Vaccine 37+ Years old (1 of 2 - PCV) 07/06/2023 (Originally 11/05/1951)   MAMMOGRAM  07/06/2023 (Originally 04/27/2014)   DEXA SCAN  07/06/2023 (Originally 11/05/2012)   INFLUENZA VACCINE  08/19/2022   Medicare Annual Wellness (AWV)  07/06/2023   Colonoscopy  09/28/2023   Hepatitis C Screening  Completed   HPV VACCINES  Aged Out   DTaP/Tdap/Td  Discontinued    -See a dentist at least yearly  -Get your eyes checked and then per your eye specialist's recommendations  -Other issues addressed today:  Please consider quitting smoking, if you decide to quit please let us know and we can help!  -I have included below further information regarding a healthy whole foods based diet, physical activity guidelines for adults, stress management and opportunities for social connections. I hope you find this information useful.   -----------------------------------------------------------------------------------------------------------------------------------------------------------------------------------------------------------------------------------------------------------  NUTRITION: -eat real food: lots of colorful vegetables (half the plate) and fruits -5-7 servings of vegetables and fruits per day (fresh or steamed is best), exp. 2 servings of vegetables with lunch and dinner and 2 servings of fruit per day. Berries and greens such as kale and collards  are great choices.  -consume on a regular basis: whole grains (make sure first ingredient on label contains the word "whole"), fresh fruits, fish, nuts, seeds, healthy oils (such as olive oil, avocado oil, grape seed oil) -may eat small amounts of dairy and lean meat on occasion, but avoid processed meats such as ham, bacon, lunch meat, etc. -drink water -try to avoid fast food and pre-packaged foods, processed meat -most experts advise limiting sodium to < 2300mg  per day, should limit further is any chronic conditions such as high blood pressure, heart disease, diabetes, etc. The American Heart Association advised that < 1500mg  is is ideal -try to avoid foods that contain any ingredients with names you do not recognize  -try to avoid sugar/sweets (except for the natural sugar that occurs in fresh fruit) -try to avoid sweet drinks -try to avoid white rice, white bread, pasta (unless whole grain), white or yellow potatoes  EXERCISE GUIDELINES FOR ADULTS: -if you wish to increase your physical activity, do so gradually and with the approval of your doctor -STOP and seek medical care immediately if you have any chest pain, chest discomfort or trouble breathing when starting or increasing exercise  -move and stretch your body, legs, feet and arms when sitting for long periods -Physical activity guidelines for optimal  health in adults: -least 150 minutes per week of aerobic exercise (can talk, but not sing) once approved by your doctor, 20-30 minutes of sustained activity or two 10 minute episodes of sustained activity every day.  -resistance training at least 2 days per week if approved by your doctor -balance exercises 3+ days per week:   Stand somewhere where you have something sturdy to hold onto if you lose balance.    1) lift up on toes, start with 5x per day and work up to 20x   2) stand and lift on leg straight out to the side so that foot is a few inches of the floor, start with 5x each side  and work up to 20x each side   3) stand on one foot, start with 5 seconds each side and work up to 20 seconds on each side  If you need ideas or help with getting more active:  -Silver sneakers https://tools.silversneakers.com  -Walk with a Doc: http://www.duncan-williams.com/  -try to include resistance (weight lifting/strength building) and balance exercises twice per week: or the following link for ideas: http://castillo-powell.com/  BuyDucts.dk  STRESS MANAGEMENT: -can try meditating, or just sitting quietly with deep breathing while intentionally relaxing all parts of your body for 5 minutes daily -if you need further help with stress, anxiety or depression please follow up with your primary doctor or contact the wonderful folks at [facility]: [phone]  SOCIAL CONNECTIONS: -options in [place] if you wish to engage in more social and exercise related activities:  -Silver sneakers https://tools.silversneakers.com  -Walk with a Doc: http://www.duncan-williams.com/  -Check out the [facility] Active Adults 50+ section on the [place] of [employer] (hiking clubs, book clubs, cards and games, chess, exercise classes, aquatic classes and much more) - see the website for details: https://www.[place].gov/departments/parks-recreation/active-adults50  -YouTube has lots of exercise videos for different ages and abilities as well  -[facility] (a variety of indoor and outdoor inperson activities for adults). 9522466948. [address]  -Virtual Online Classes (a variety of topics): see seniorplanet.org or call [phone]  -consider volunteering at a school, hospice center, church, senior center or elsewhere   ADVANCED HEALTHCARE DIRECTIVES:  Everyone should have advanced health care directives in place. This is so that you get the care you want, should  you ever be in a situation where you are unable to make your own medical decisions.   From the  Advanced Directive Website: "Advance Health Care Directives are legal documents in which you give written instructions about your health care if, in the future, you cannot speak for yourself.   A health care power of attorney allows you to name a person you trust to make your health care decisions if you cannot make them yourself. A declaration of a desire for a natural death (or living will) is document, which states that you desire not to have your life prolonged by extraordinary measures if you have a terminal or incurable illness or if you are in a vegetative state. An advance instruction for mental health treatment makes a declaration of instructions, information and preferences regarding your mental health treatment. It also states that you are aware that the advance instruction authorizes a mental health treatment provider to act according to your wishes. It may also outline your consent or refusal of mental health treatment. A declaration of an anatomical gift allows anyone over the age of 20 to make a gift by will, organ donor card or other document."   Please  see the following website or an elder law attorney for forms, FAQs and for completion of advanced directives: [place] TEFL teacher Health Care Directives Advance Health Care Directives (http://guzman.com/)  Or copy and paste the following to your web browser: PoshChat.fi          Terressa Koyanagi, DO

## 2022-07-06 NOTE — Patient Instructions (Signed)
I really enjoyed getting to talk with you today! I am available on Tuesdays and Thursdays for virtual visits if you have any questions or concerns, or if I can be of any further assistance.   CHECKLIST FROM ANNUAL WELLNESS VISIT:  -Follow up (please call to schedule if not scheduled after visit):   -yearly for annual wellness visit with primary care office  Here is a list of your preventive care/health maintenance measures and the plan for each if any are due:  PLAN For any measures below that may be due:   Health Maintenance  Topic Date Due   COVID-19 Vaccine (4 - 2023-24 season) 07/22/2022 (Originally 09/18/2021)   Zoster Vaccines- Shingrix (1 of 2) 10/06/2022 (Originally 11/05/1997)   Lung Cancer Screening  07/06/2023 (Originally 11/05/1997)   Pneumonia Vaccine 78+ Years old (1 of 2 - PCV) 07/06/2023 (Originally 11/05/1951)   MAMMOGRAM  07/06/2023 (Originally 04/27/2014)   DEXA SCAN  07/06/2023 (Originally 11/05/2012)   INFLUENZA VACCINE  08/19/2022   Medicare Annual Wellness (AWV)  07/06/2023   Colonoscopy  09/28/2023   Hepatitis C Screening  Completed   HPV VACCINES  Aged Out   DTaP/Tdap/Td  Discontinued    -See a dentist at least yearly  -Get your eyes checked and then per your eye specialist's recommendations  -Other issues addressed today:  Please consider quitting smoking, if you decide to quit please let us know and we can help!  -I have included below further information regarding a healthy whole foods based diet, physical activity guidelines for adults, stress management and opportunities for social connections. I hope you find this information useful.   -----------------------------------------------------------------------------------------------------------------------------------------------------------------------------------------------------------------------------------------------------------  NUTRITION: -eat real food: lots of colorful vegetables (half the  plate) and fruits -5-7 servings of vegetables and fruits per day (fresh or steamed is best), exp. 2 servings of vegetables with lunch and dinner and 2 servings of fruit per day. Berries and greens such as kale and collards are great choices.  -consume on a regular basis: whole grains (make sure first ingredient on label contains the word "whole"), fresh fruits, fish, nuts, seeds, healthy oils (such as olive oil, avocado oil, grape seed oil) -may eat small amounts of dairy and lean meat on occasion, but avoid processed meats such as ham, bacon, lunch meat, etc. -drink water -try to avoid fast food and pre-packaged foods, processed meat -most experts advise limiting sodium to < 2300mg  per day, should limit further is any chronic conditions such as high blood pressure, heart disease, diabetes, etc. The American Heart Association advised that < 1500mg  is is ideal -try to avoid foods that contain any ingredients with names you do not recognize  -try to avoid sugar/sweets (except for the natural sugar that occurs in fresh fruit) -try to avoid sweet drinks -try to avoid white rice, white bread, pasta (unless whole grain), white or yellow potatoes  EXERCISE GUIDELINES FOR ADULTS: -if you wish to increase your physical activity, do so gradually and with the approval of your doctor -STOP and seek medical care immediately if you have any chest pain, chest discomfort or trouble breathing when starting or increasing exercise  -move and stretch your body, legs, feet and arms when sitting for long periods -Physical activity guidelines for optimal health in adults: -least 150 minutes per week of aerobic exercise (can talk, but not sing) once approved by your doctor, 20-30 minutes of sustained activity or two 10 minute episodes of sustained activity every day.  -resistance training at least 2 days per  week if approved by your doctor -balance exercises 3+ days per week:   Stand somewhere where you have something  sturdy to hold onto if you lose balance.    1) lift up on toes, start with 5x per day and work up to 20x   2) stand and lift on leg straight out to the side so that foot is a few inches of the floor, start with 5x each side and work up to 20x each side   3) stand on one foot, start with 5 seconds each side and work up to 20 seconds on each side  If you need ideas or help with getting more active:  -Silver sneakers https://tools.silversneakers.com  -Walk with a Doc: http://www.duncan-williams.com/  -try to include resistance (weight lifting/strength building) and balance exercises twice per week: or the following link for ideas: http://castillo-powell.com/  BuyDucts.dk  STRESS MANAGEMENT: -can try meditating, or just sitting quietly with deep breathing while intentionally relaxing all parts of your body for 5 minutes daily -if you need further help with stress, anxiety or depression please follow up with your primary doctor or contact the wonderful folks at [facility]: [phone]  SOCIAL CONNECTIONS: -options in [place] if you wish to engage in more social and exercise related activities:  -Silver sneakers https://tools.silversneakers.com  -Walk with a Doc: http://www.duncan-williams.com/  -Check out the [facility] Active Adults 50+ section on the [place] [employer] (hiking clubs, book clubs, cards and games, chess, exercise classes, aquatic classes and much more) - see the website for details: https://www.[place].gov/departments/parks-recreation/active-adults50  -YouTube has lots of exercise videos for different ages and abilities as well  -[facility] (a variety of indoor and outdoor inperson activities for adults). [phone]. [address]  -Virtual Online Classes (a variety of topics): see seniorplanet.org or call [phone]  -consider  volunteering at a school, hospice center, church, senior center or elsewhere   ADVANCED HEALTHCARE DIRECTIVES:  Everyone should have advanced health care directives in place. This is so that you get the care you want, should you ever be in a situation where you are unable to make your own medical decisions.   From the [place] Advanced Directive Website: "Advance Health Care Directives are legal documents in which you give written instructions about your health care if, in the future, you cannot speak for yourself.   A health care power of attorney allows you to name a person you trust to make your health care decisions if you cannot make them yourself. A declaration of a desire for a natural death (or living will) is document, which states that you desire not to have your life prolonged by extraordinary measures if you have a terminal or incurable illness or if you are in a vegetative state. An advance instruction for mental health treatment makes a declaration of instructions, information and preferences regarding your mental health treatment. It also states that you are aware that the advance instruction authorizes a mental health treatment provider to act according to your wishes. It may also outline your consent or refusal of mental health treatment. A declaration of an anatomical gift allows anyone over the age of 26 to make a gift by will, organ donor card or other document."   Please see the following website or an elder law attorney for forms, FAQs and for completion of advanced directives: [place] Health Care Directives Advance Health Care Directives (http://guzman.com/)  Or copy and paste the following to your web browser: PoshChat.fi

## 2022-09-14 ENCOUNTER — Other Ambulatory Visit: Payer: Self-pay | Admitting: Family Medicine

## 2022-09-14 DIAGNOSIS — I10 Essential (primary) hypertension: Secondary | ICD-10-CM

## 2022-09-14 DIAGNOSIS — E785 Hyperlipidemia, unspecified: Secondary | ICD-10-CM

## 2022-09-23 ENCOUNTER — Encounter: Payer: Self-pay | Admitting: Family Medicine

## 2022-09-23 ENCOUNTER — Ambulatory Visit (INDEPENDENT_AMBULATORY_CARE_PROVIDER_SITE_OTHER): Payer: Medicare Other | Admitting: Family Medicine

## 2022-09-23 VITALS — BP 144/90 | HR 85 | Temp 98.2°F | Ht 66.0 in | Wt 166.7 lb

## 2022-09-23 DIAGNOSIS — R7303 Prediabetes: Secondary | ICD-10-CM

## 2022-09-23 DIAGNOSIS — L309 Dermatitis, unspecified: Secondary | ICD-10-CM | POA: Diagnosis not present

## 2022-09-23 DIAGNOSIS — I10 Essential (primary) hypertension: Secondary | ICD-10-CM | POA: Diagnosis not present

## 2022-09-23 DIAGNOSIS — E785 Hyperlipidemia, unspecified: Secondary | ICD-10-CM | POA: Diagnosis not present

## 2022-09-23 DIAGNOSIS — D649 Anemia, unspecified: Secondary | ICD-10-CM

## 2022-09-23 DIAGNOSIS — L03115 Cellulitis of right lower limb: Secondary | ICD-10-CM | POA: Diagnosis not present

## 2022-09-23 MED ORDER — SULFAMETHOXAZOLE-TRIMETHOPRIM 800-160 MG PO TABS
1.0000 | ORAL_TABLET | Freq: Two times a day (BID) | ORAL | 0 refills | Status: AC
Start: 2022-09-23 — End: 2022-09-30

## 2022-09-23 MED ORDER — TRIAMCINOLONE ACETONIDE 0.1 % EX CREA
1.0000 | TOPICAL_CREAM | Freq: Two times a day (BID) | CUTANEOUS | 1 refills | Status: DC
Start: 2022-09-23 — End: 2022-11-19

## 2022-09-23 NOTE — Progress Notes (Signed)
Established Patient Office Visit  Subjective   Patient ID: Alexandra Flores, female    DOB: 03/29/1945  Age: 77 y.o. MRN: [id number]  Chief Complaint  Patient presents with   Edema    Patient complains of swelling and redness right lower extremity x2 weeks, initially suspected bug bite or eczema, tried Cortisone 10, noticed swelling in the right foot last night    Pt is reporting a 2 week history of having a spot on her right leg. States that it was itchy at first, thought it was a bug bite, was putting alcohol on it but then it spread and started weeping, then she put poison ivy cream on it which did not really help either. States that the weeping was a bit purulent, now it is clear weeping. States that her foot started swelling on that side yesterday.  HTN-- pt reports she doesn't always remember to take her medication. States that she is checking her BP at home, states that it is usually around 120-130's. We discussed that she could try switching to taking them at nighttime.  Pt reports she is having more fatigue, states that her energy levels are lower and she is not used to this, states that she used to be able to move her furniture around be herself but now she is having trouble with this.     Current Outpatient Medications  Medication Instructions   amLODipine (NORVASC) 10 MG tablet TAKE 1 TABLET BY MOUTH EVERY DAY   atorvastatin (LIPITOR) 20 mg, Oral, Daily   calcium carbonate (TUMS - DOSED IN MG ELEMENTAL CALCIUM) 500 MG chewable tablet 1 tablet, Oral, As needed   cyclobenzaprine (FLEXERIL) 10 MG tablet TAKE 1/2 TO 1 TABLET (5-10 MG TOTAL) BY MOUTH EVERY DAY AT BEDTIME   hydrochlorothiazide (HYDRODIURIL) 50 mg, Oral, Daily   lisinopril (ZESTRIL) 40 mg, Oral, Daily   LORazepam (ATIVAN) 0.5 mg, Oral, 2 times daily PRN   meloxicam (MOBIC) 7.5 mg, Oral, Daily   Multiple Vitamins-Minerals (MULTIVITAMIN ADULTS PO) Oral   prochlorperazine (COMPAZINE) 5 mg, Oral, Every 6 hours PRN    sulfamethoxazole-trimethoprim (BACTRIM DS) 800-160 MG tablet 1 tablet, Oral, 2 times daily   triamcinolone cream (KENALOG) 0.1 % 1 Application, Topical, 2 times daily    Patient Active Problem List   Diagnosis Date Noted   Prediabetes 08/21/2021   Intestinal infection due to enteropathogenic E. coli 07/28/2021   Infectious diarrhea 07/28/2021   Leukocytosis 07/28/2021   Hyperlipemia 09/30/2015   Fibroids 03/23/2012   Tobacco use 02/10/2012   KNEE PAIN 03/05/2008   Essential hypertension 03/13/2007   GERD 03/13/2007      Review of Systems  Constitutional:  Negative for chills and fever.  All other systems reviewed and are negative.     Objective:     BP (!) 144/90 (BP Location: Right Arm, Patient Position: Sitting, Cuff Size: Normal)   Pulse 85   Temp 98.2 F (36.8 C) (Oral)   Ht 5\' 6"  (1.676 m)   Wt 166 lb 11.2 oz (75.6 kg)   SpO2 96%   BMI 26.91 kg/m    Physical Exam Vitals reviewed.  Constitutional:      Appearance: Normal appearance. She is well-groomed and normal weight.  Eyes:     Conjunctiva/sclera: Conjunctivae normal.  Neck:     Thyroid: No thyromegaly.  Cardiovascular:     Rate and Rhythm: Normal rate and regular rhythm.     Pulses: Normal pulses.     Heart sounds: S1  normal and S2 normal.  Pulmonary:     Effort: Pulmonary effort is normal.     Breath sounds: Normal breath sounds and air entry.  Abdominal:     General: Bowel sounds are normal.  Musculoskeletal:     Right lower leg: No edema.     Left lower leg: No edema.  Skin:    Findings: Erythema (red raised confluent rash on the right lower shin area. there is mild edema in this area as well) present.  Neurological:     Mental Status: She is alert and oriented to person, place, and time. Mental status is at baseline.     Gait: Gait is intact.  Psychiatric:        Mood and Affect: Mood and affect normal.        Speech: Speech normal.        Behavior: Behavior normal.        Judgment:  Judgment normal.      The 10-year ASCVD risk score (Arnett DK, et al., 2019) is: 19%    Assessment & Plan:  Cellulitis of right lower extremity The area on her leg appears to be infected, since it is spreading and her cortisone cream is not improving the area. I will call in bactrim DS to treat infection. Pt is to RTC if her symptoms do not improve with the treatment.  -     Sulfamethoxazole-Trimethoprim; Take 1 tablet by mouth 2 (two) times daily for 7 days.  Dispense: 14 tablet; Refill: 0  Eczema, unspecified type Needs refills, she has been using this on the spot on her leg without improvement.   -     Triamcinolone Acetonide; Apply 1 Application topically 2 (two) times daily.  Dispense: 45 g; Refill: 1  Hyperlipidemia, unspecified hyperlipidemia type -     Lipid panel  Essential hypertension Assessment & Plan: Current hypertension medications:       Sig   amLODipine (NORVASC) 10 MG tablet (Taking) TAKE 1 TABLET BY MOUTH EVERY DAY   hydrochlorothiazide (HYDRODIURIL) 50 MG tablet (Taking) TAKE 1 TABLET BY MOUTH EVERY DAY   lisinopril (ZESTRIL) 40 MG tablet (Taking) TAKE 1 TABLET BY MOUTH EVERY DAY      BP is elevated today however pt states she often forgets to take her medication. I advised that she set alarm/ reminders on her phone so that it will be easier. She is due for her annual labs today. Orders placed.   Orders: -     Comprehensive metabolic panel -     TSH  Anemia, unspecified type History of, will check new labs, this might be causing her fatigue that she is reporting....  -     CBC with Differential/Platelet -     Iron, TIBC and Ferritin Panel  Prediabetes Assessment & Plan: Needs new A1C today for surveillance.  Orders: -     Hemoglobin A1c     Return in about 6 months (around 03/23/2023) for HTN.    Karie Georges, MD

## 2022-09-24 LAB — CBC WITH DIFFERENTIAL/PLATELET
Basophils Absolute: 0.1 10*3/uL (ref 0.0–0.1)
Basophils Relative: 1.4 % (ref 0.0–3.0)
Eosinophils Absolute: 0.3 10*3/uL (ref 0.0–0.7)
Eosinophils Relative: 4.8 % (ref 0.0–5.0)
HCT: 32.9 % — ABNORMAL LOW (ref 36.0–46.0)
Hemoglobin: 10.5 g/dL — ABNORMAL LOW (ref 12.0–15.0)
Lymphocytes Relative: 38.2 % (ref 12.0–46.0)
Lymphs Abs: 2.4 10*3/uL (ref 0.7–4.0)
MCHC: 31.8 g/dL (ref 30.0–36.0)
MCV: 81.9 fl (ref 78.0–100.0)
Monocytes Absolute: 0.5 10*3/uL (ref 0.1–1.0)
Monocytes Relative: 7.4 % (ref 3.0–12.0)
Neutro Abs: 3.1 10*3/uL (ref 1.4–7.7)
Neutrophils Relative %: 48.2 % (ref 43.0–77.0)
Platelets: 322 10*3/uL (ref 150.0–400.0)
RBC: 4.02 Mil/uL (ref 3.87–5.11)
RDW: 16 % — ABNORMAL HIGH (ref 11.5–15.5)
WBC: 6.4 10*3/uL (ref 4.0–10.5)

## 2022-09-24 LAB — COMPREHENSIVE METABOLIC PANEL
ALT: 10 U/L (ref 0–35)
AST: 14 U/L (ref 0–37)
Albumin: 4.1 g/dL (ref 3.5–5.2)
Alkaline Phosphatase: 72 U/L (ref 39–117)
BUN: 9 mg/dL (ref 6–23)
CO2: 29 meq/L (ref 19–32)
Calcium: 9.9 mg/dL (ref 8.4–10.5)
Chloride: 105 meq/L (ref 96–112)
Creatinine, Ser: 0.8 mg/dL (ref 0.40–1.20)
GFR: 72.35 mL/min (ref >60.00)
Glucose, Bld: 69 mg/dL — ABNORMAL LOW (ref 70–99)
Potassium: 3.6 meq/L (ref 3.5–5.1)
Sodium: 141 meq/L (ref 135–145)
Total Bilirubin: 0.5 mg/dL (ref 0.2–1.2)
Total Protein: 7.8 g/dL (ref 6.0–8.3)

## 2022-09-24 LAB — LIPID PANEL
Cholesterol: 130 mg/dL (ref 0–200)
HDL: 32.1 mg/dL — ABNORMAL LOW (ref >39.00)
LDL Cholesterol: 82 mg/dL (ref 0–99)
NonHDL: 98.37
Total CHOL/HDL Ratio: 4
Triglycerides: 83 mg/dL (ref 0.0–149.0)
VLDL: 16.6 mg/dL (ref 0.0–40.0)

## 2022-09-24 LAB — TSH: TSH: 0.59 u[IU]/mL (ref 0.35–5.50)

## 2022-09-24 LAB — IRON,TIBC AND FERRITIN PANEL
%SAT: 20 % (ref 16–45)
Ferritin: 53 ng/mL (ref 16–288)
Iron: 55 ug/dL (ref 45–160)
TIBC: 277 ug/dL (ref 250–450)

## 2022-09-24 LAB — HEMOGLOBIN A1C: Hgb A1c MFr Bld: 6.4 % (ref 4.6–6.5)

## 2022-09-27 NOTE — Assessment & Plan Note (Signed)
Current hypertension medications:       Sig   amLODipine (NORVASC) 10 MG tablet (Taking) TAKE 1 TABLET BY MOUTH EVERY DAY   hydrochlorothiazide (HYDRODIURIL) 50 MG tablet (Taking) TAKE 1 TABLET BY MOUTH EVERY DAY   lisinopril (ZESTRIL) 40 MG tablet (Taking) TAKE 1 TABLET BY MOUTH EVERY DAY      BP is elevated today however pt states she often forgets to take her medication. I advised that she set alarm/ reminders on her phone so that it will be easier. She is due for her annual labs today. Orders placed.

## 2022-09-27 NOTE — Assessment & Plan Note (Signed)
Needs new A1C today for surveillance.

## 2022-10-01 ENCOUNTER — Telehealth: Payer: Self-pay | Admitting: Family Medicine

## 2022-10-01 ENCOUNTER — Ambulatory Visit
Admission: EM | Admit: 2022-10-01 | Discharge: 2022-10-01 | Disposition: A | Discharge status: Home / Self Care | Payer: Medicare Other | Attending: Physician Assistant | Admitting: Physician Assistant

## 2022-10-01 DIAGNOSIS — L03115 Cellulitis of right lower limb: Secondary | ICD-10-CM

## 2022-10-01 DIAGNOSIS — M7989 Other specified soft tissue disorders: Secondary | ICD-10-CM

## 2022-10-01 MED ORDER — SULFAMETHOXAZOLE-TRIMETHOPRIM 800-160 MG PO TABS
1.0000 | ORAL_TABLET | Freq: Two times a day (BID) | ORAL | 0 refills | Status: AC
Start: 2022-10-01 — End: 2022-10-04

## 2022-10-01 NOTE — Telephone Encounter (Signed)
At this point she might need to go to urgent care-- if the antibiotics did not help then I would recommend she be ruled out for a blood clot in the leg, although I did not think that was likely with the rash there. But that could be the cause of the swelling. I would recommend she go there so they could run another blood test to rule out the blood clot.

## 2022-10-01 NOTE — ED Triage Notes (Signed)
"  I am following up from an infected bug bite on my lower right leg". "My provider/PCP has put me on an antibiotic which has seemed to work but now my right foot is swelling". No fever.

## 2022-10-01 NOTE — Telephone Encounter (Signed)
Spoke with the patient and informed her of the message below.  I offered to assist with an urgent care close to her home, the patient stated the [facility] [place] [facility] is closest to her and asked if an appt was needed.  Per Judie Grieve at [facility] an appt is not necessary and the patient can walk in and the patient was informed.

## 2022-10-01 NOTE — Telephone Encounter (Signed)
Pt said md on 09-23-2022 and per pt md told her to call her if her foot is still swelling. Pt has finished the abx and foot still swelling

## 2022-10-01 NOTE — ED Provider Notes (Signed)
[facility]    CSN: [id number] Arrival date & time: 10/01/22  1406      History   Chief Complaint Chief Complaint  Patient presents with   Foot Swelling    Right    HPI Alexandra Flores is a 77 y.o. female.   Patient here today for evaluation of continued swelling to her right lower ankle and foot.  She states that she was seen for cellulitis about a week ago after having a "bug bite" 2 weeks prior to that.  She notes that she did have worsening swelling and pain but antibiotic prescribed at PCP is help with this she is just concerned that she continues to have some distal swelling.  She notes that when she goes to bed at night and wakes up in the morning it is not quite as significant.  She denies any numbness or tingling.  The history is provided by the patient.    Past Medical History:  Diagnosis Date   Anxiety    Eczema    GERD (gastroesophageal reflux disease)    Hyperlipemia 09/30/2015   Hypertension    Tobacco use 02/10/2012    Patient Active Problem List   Diagnosis Date Noted   Prediabetes 08/21/2021   Intestinal infection due to enteropathogenic E. coli 07/28/2021   Infectious diarrhea 07/28/2021   Leukocytosis 07/28/2021   Hyperlipemia 09/30/2015   Fibroids 03/23/2012   Tobacco use 02/10/2012   KNEE PAIN 03/05/2008   Essential hypertension 03/13/2007   GERD 03/13/2007    Past Surgical History:  Procedure Laterality Date   BREAST SURGERY     benign cyst removal    OB History   No obstetric history on file.      Home Medications    Prior to Admission medications   Medication Sig Start Date End Date Taking? Authorizing Provider  amLODipine (NORVASC) 10 MG tablet TAKE 1 TABLET BY MOUTH EVERY DAY 09/14/22  Yes Karie Georges, MD  atorvastatin (LIPITOR) 20 MG tablet TAKE 1 TABLET BY MOUTH EVERY DAY 09/14/22  Yes Karie Georges, MD  hydrochlorothiazide (HYDRODIURIL) 50 MG tablet TAKE 1 TABLET BY MOUTH EVERY DAY 09/14/22  Yes  Karie Georges, MD  lisinopril (ZESTRIL) 40 MG tablet TAKE 1 TABLET BY MOUTH EVERY DAY 09/14/22  Yes Karie Georges, MD  LORazepam (ATIVAN) 0.5 MG tablet Take 1 tablet (0.5 mg total) by mouth 2 (two) times daily as needed for anxiety. 05/25/21  Yes Koberlein, Junell C, MD  meloxicam (MOBIC) 7.5 MG tablet Take 1 tablet (7.5 mg total) by mouth daily. 01/01/22  Yes Karie Georges, MD  Multiple Vitamins-Minerals (MULTIVITAMIN ADULTS PO) Take by mouth.   Yes [provider]  sulfamethoxazole-trimethoprim (BACTRIM DS) 800-160 MG tablet Take 1 tablet by mouth 2 (two) times daily for 3 days. 10/01/22 10/04/22 Yes Tomi Bamberger, PA-C  triamcinolone cream (KENALOG) 0.1 % Apply 1 Application topically 2 (two) times daily. 09/23/22  Yes Karie Georges, MD  calcium carbonate (TUMS - DOSED IN MG ELEMENTAL CALCIUM) 500 MG chewable tablet Chew 1 tablet by mouth as needed for indigestion or heartburn.    [provider]  cyclobenzaprine (FLEXERIL) 10 MG tablet TAKE 1/2 TO 1 TABLET (5-10 MG TOTAL) BY MOUTH EVERY DAY AT BEDTIME 03/10/22   Karie Georges, MD    Family History Family History  Problem Relation Age of Onset   Stroke Mother 56   Hypertension Mother    Arthritis Father  Eczema Father    Cancer Sister 81       'stomach cancer'    Asthma Sister    HIV/AIDS Brother    Colon cancer Neg Hx     Social History Social History   Tobacco Use   Smoking status: Every Day    Current packs/day: 1.00    Average packs/day: 1 pack/day for 48.0 years (48.0 ttl pk-yrs)    Types: Cigarettes   Smokeless tobacco: Never   Tobacco comments:    per patient not even a pack a day; takes 3 puffs and puts it out   Vaping Use   Vaping status: Never Used  Substance Use Topics   Alcohol use: Yes    Alcohol/week: 0.0 standard drinks of alcohol    Comment: occ    Drug use: Never     Allergies   Patient has no known allergies.   Review of Systems Review of Systems   Constitutional:  Negative for chills and fever.  Eyes:  Negative for discharge and redness.  Respiratory:  Negative for shortness of breath.   Gastrointestinal:  Negative for abdominal pain, nausea and vomiting.  Musculoskeletal:  Negative for arthralgias.  Skin:  Negative for color change and wound.  Neurological:  Negative for numbness.     Physical Exam Triage Vital Signs ED Triage Vitals  Encounter Vitals Group     BP 10/01/22 1443 130/68     Systolic BP Percentile --      Diastolic BP Percentile --      Pulse Rate 10/01/22 1443 87     Resp 10/01/22 1443 18     Temp 10/01/22 1443 98.2 F (36.8 C)     Temp Source 10/01/22 1443 Oral     SpO2 10/01/22 1443 97 %     Weight 10/01/22 1440 166 lb 10.7 oz (75.6 kg)     Height 10/01/22 1440 5\' 6"  (1.676 m)     Head Circumference --      Peak Flow --      Pain Score 10/01/22 1437 0     Pain Loc --      Pain Education --      Exclude from Growth Chart --    No data found.  Updated Vital Signs BP 130/68 (BP Location: Left Arm)   Pulse 87   Temp 98.2 F (36.8 C) (Oral)   Resp 18   Ht 5\' 6"  (1.676 m)   Wt 166 lb 10.7 oz (75.6 kg)   SpO2 97%   BMI 26.90 kg/m       Physical Exam Vitals and nursing note reviewed.  Constitutional:      General: She is not in acute distress.    Appearance: Normal appearance. She is not ill-appearing.  HENT:     Head: Normocephalic and atraumatic.  Eyes:     Conjunctiva/sclera: Conjunctivae normal.  Cardiovascular:     Rate and Rhythm: Normal rate.  Pulmonary:     Effort: Pulmonary effort is normal. No respiratory distress.  Musculoskeletal:     Comments: Edema noted to right ankle and proximal foot with no tenderness to palpation, no erythema, no warmth, no swelling appreciated to right lower leg.  Neurological:     Mental Status: She is alert.  Psychiatric:        Mood and Affect: Mood normal.        Behavior: Behavior normal.        Thought Content: Thought content normal.  UC Treatments / Results  Labs (all labs ordered are listed, but only abnormal results are displayed) Labs Reviewed - No data to display  EKG   Radiology No results found.  Procedures Procedures (including critical care time)  Medications Ordered in UC Medications - No data to display  Initial Impression / Assessment and Plan / UC Course  I have reviewed the triage vital signs and the nursing notes.  Pertinent labs & imaging results that were available during my care of the patient were reviewed by me and considered in my medical decision making (see chart for details).    Suspect edema from inflammation from recent infection, will add 3 more days of Bactrim for total of 10 days of treatment.  Recommended compression, elevation to hopefully improve edema and encouraged follow-up with primary care next week.  Patient expresses understanding.  Encouraged sooner follow-up with any further concerns or worsening symptoms.  Final Clinical Impressions(s) / UC Diagnoses   Final diagnoses:  Cellulitis of right lower leg  Right leg swelling   Discharge Instructions   None    ED Prescriptions     Medication Sig Dispense Auth. Provider   sulfamethoxazole-trimethoprim (BACTRIM DS) 800-160 MG tablet Take 1 tablet by mouth 2 (two) times daily for 3 days. 6 tablet Tomi Bamberger, PA-C      PDMP not reviewed this encounter.   Tomi Bamberger, PA-C 10/01/22 [phone]

## 2022-10-01 NOTE — ED Notes (Signed)
Ace Wrap applied with directions and information to Right ankle/foot for compression per Provider.  B. Roten CMA

## 2022-10-15 ENCOUNTER — Other Ambulatory Visit: Payer: Self-pay

## 2022-10-15 ENCOUNTER — Other Ambulatory Visit: Payer: Medicare Other

## 2022-10-15 NOTE — Progress Notes (Signed)
   10/15/2022  Patient ID: Alexandra Flores, female   DOB: [birth year], 77 y.o.   MRN: [id number]  Contacted with patient via telephone.  Hypertension:  Current medications:  Current hypertension medications:       Sig   amLODipine (NORVASC) 10 MG tablet TAKE 1 TABLET BY MOUTH EVERY DAY   hydrochlorothiazide (HYDRODIURIL) 50 MG tablet TAKE 1 TABLET BY MOUTH EVERY DAY   lisinopril (ZESTRIL) 40 MG tablet TAKE 1 TABLET BY MOUTH EVERY DAY       Patient has a validated, automated, upper arm home BP cuff Current blood pressure readings readings: 120/64  Patient denies hypotensive s/sx including dizziness, lightheadedness.  Patient denies hypertensive symptoms including headache, chest pain, shortness of breath  Patient has noted improved BP readings since switching her hydrochlorothiazide from evening to morning dose. Instructed to continue medications and monitor BP at least once weekly.  Sherrill Raring, PharmD Clinical Pharmacist [phone]

## 2022-11-02 ENCOUNTER — Other Ambulatory Visit: Payer: Self-pay | Admitting: Family Medicine

## 2022-11-02 DIAGNOSIS — L309 Dermatitis, unspecified: Secondary | ICD-10-CM

## 2022-11-04 ENCOUNTER — Telehealth: Payer: Self-pay | Admitting: Family Medicine

## 2022-11-04 DIAGNOSIS — I10 Essential (primary) hypertension: Secondary | ICD-10-CM

## 2022-11-04 MED ORDER — HYDROCHLOROTHIAZIDE 50 MG PO TABS: 50.0000 mg | ORAL_TABLET | Freq: Every day | ORAL | 1 refills | Status: DC

## 2022-11-04 NOTE — Telephone Encounter (Signed)
Rx done.

## 2022-11-04 NOTE — Telephone Encounter (Signed)
Prescription Request  11/04/2022  LOV: 09/23/2022  What is the name of the medication or equipment?  hydrochlorothiazide (HYDRODIURIL) 50 MG tablet  Have you contacted your pharmacy to request a refill? No   Which pharmacy would you like this sent to?  [id number] [place], [place] - [address] [place] [address] Phone: [phone] Fax: [phone]    Patient notified that their request is being sent to the clinical staff for review and that they should receive a response within 2 business days.   Please advise at Mobile [phone] (mobile)

## 2022-11-04 NOTE — Addendum Note (Signed)
Addended by: Johnella Moloney on: 11/04/2022 04:08 PM   Modules accepted: Orders

## 2022-11-19 ENCOUNTER — Other Ambulatory Visit: Payer: Self-pay

## 2022-11-19 DIAGNOSIS — L309 Dermatitis, unspecified: Secondary | ICD-10-CM

## 2022-11-19 MED ORDER — TRIAMCINOLONE ACETONIDE 0.1 % EX CREA: 1.0000 | TOPICAL_CREAM | Freq: Two times a day (BID) | CUTANEOUS | 1 refills | Status: AC

## 2023-02-03 ENCOUNTER — Other Ambulatory Visit: Payer: Self-pay | Admitting: Family Medicine

## 2023-02-03 DIAGNOSIS — E785 Hyperlipidemia, unspecified: Secondary | ICD-10-CM

## 2023-02-03 DIAGNOSIS — I10 Essential (primary) hypertension: Secondary | ICD-10-CM

## 2023-02-03 MED ORDER — ATORVASTATIN CALCIUM 20 MG PO TABS: 20.0000 mg | ORAL_TABLET | Freq: Every day | ORAL | 0 refills | Status: DC

## 2023-02-03 MED ORDER — LISINOPRIL 40 MG PO TABS: 40.0000 mg | ORAL_TABLET | Freq: Every day | ORAL | 0 refills | Status: DC

## 2023-02-03 MED ORDER — AMLODIPINE BESYLATE 10 MG PO TABS: ORAL_TABLET | ORAL | 0 refills | Status: DC

## 2023-02-03 NOTE — Telephone Encounter (Signed)
Copied from CRM [id number]. Topic: Clinical - Medication Refill >> Feb 03, 2023  1:52 PM Lorretta Harp wrote: Most Recent Primary Care Visit:  Provider: Karie Georges  Department: [facility]  Visit Type: OFFICE VISIT  Date: 09/23/2022  Medication: lisinopril (ZESTRIL) 40 MG tablet  Has the patient contacted their pharmacy? Yes (Agent: If no, request that the patient contact the pharmacy for the refill. If patient does not wish to contact the pharmacy document the reason why and proceed with request.) (Agent: If yes, when and what did the pharmacy advise?)  Is this the correct pharmacy for this prescription? Yes If no, delete pharmacy and type the correct one.  This is the patient's preferred pharmacy:  [facility] [address]  - [address] [place] [address] Phone: [phone] Fax: [phone]   Has the prescription been filled recently? No  Is the patient out of the medication? Yes  Has the patient been seen for an appointment in the last year OR does the patient have an upcoming appointment? Yes  Can we respond through MyChart? No  Agent: Please be advised that Rx refills may take up to 3 business days. We ask that you follow-up with your pharmacy.

## 2023-02-03 NOTE — Telephone Encounter (Signed)
Copied from CRM [id number]. Topic: Clinical - Medication Refill >> Feb 03, 2023  1:23 PM Lorretta Harp wrote: Most Recent Primary Care Visit:  Provider: Karie Georges  Department: [facility]  Visit Type: OFFICE VISIT  Date: 09/23/2022  Medication: atorvastatin (LIPITOR) 20 MG tablet  Has the patient contacted their pharmacy? Yes (Agent: If no, request that the patient contact the pharmacy for the refill. If patient does not wish to contact the pharmacy document the reason why and proceed with request.) (Agent: If yes, when and what did the pharmacy advise?)  Is this the correct pharmacy for this prescription? Yes If no, delete pharmacy and type the correct one.  This is the patient's preferred pharmacy:  [facility] [address], [place] - [address] [place] [address] Phone: [phone] Fax: [phone]   Has the prescription been filled recently? No  Is the patient out of the medication? Yes  Has the patient been seen for an appointment in the last year OR does the patient have an upcoming appointment? Yes  Can we respond through MyChart? No  Agent: Please be advised that Rx refills may take up to 3 business days. We ask that you follow-up with your pharmacy.

## 2023-02-03 NOTE — Telephone Encounter (Signed)
Copied from CRM [id number]. Topic: Clinical - Medication Refill >> Feb 03, 2023  1:52 PM Lorretta Harp wrote: Most Recent Primary Care Visit:  Provider: Karie Georges  Department: [facility]  Visit Type: OFFICE VISIT  Date: 09/23/2022  Medication: ***  Has the patient contacted their pharmacy?  (Agent: If no, request that the patient contact the pharmacy for the refill. If patient does not wish to contact the pharmacy document the reason why and proceed with request.) (Agent: If yes, when and what did the pharmacy advise?)  Is this the correct pharmacy for this prescription?  If no, delete pharmacy and type the correct one.  This is the patient's preferred pharmacy:  [facility] [address] [place] - [address] [place] [address] Phone: [phone] Fax: [phone]   Has the prescription been filled recently?   Is the patient out of the medication?   Has the patient been seen for an appointment in the last year OR does the patient have an upcoming appointment?   Can we respond through MyChart?   Agent: Please be advised that Rx refills may take up to 3 business days. We ask that you follow-up with your pharmacy.

## 2023-02-03 NOTE — Telephone Encounter (Signed)
Copied from CRM [id number]. Topic: Clinical - Medication Refill >> Feb 03, 2023  1:23 PM Lorretta Harp wrote: Most Recent Primary Care Visit:  Provider: Karie Georges  Department: [facility]  Visit Type: OFFICE VISIT  Date: 09/23/2022  Medication: ***  Has the patient contacted their pharmacy?  (Agent: If no, request that the patient contact the pharmacy for the refill. If patient does not wish to contact the pharmacy document the reason why and proceed with request.) (Agent: If yes, when and what did the pharmacy advise?)  Is this the correct pharmacy for this prescription?  If no, delete pharmacy and type the correct one.  This is the patient's preferred pharmacy:  [facility] [address] [place] - [address] [place] [address] Phone: [phone] Fax: [phone]   Has the prescription been filled recently?   Is the patient out of the medication?   Has the patient been seen for an appointment in the last year OR does the patient have an upcoming appointment?   Can we respond through MyChart?   Agent: Please be advised that Rx refills may take up to 3 business days. We ask that you follow-up with your pharmacy.

## 2023-02-03 NOTE — Telephone Encounter (Signed)
Copied from CRM [id number]. Topic: Clinical - Medication Refill >> Feb 03, 2023  1:47 PM Lorretta Harp wrote: Most Recent Primary Care Visit:  Provider: Karie Georges  Department: [facility]  Visit Type: OFFICE VISIT  Date: 09/23/2022  Medication: ***  Has the patient contacted their pharmacy?  (Agent: If no, request that the patient contact the pharmacy for the refill. If patient does not wish to contact the pharmacy document the reason why and proceed with request.) (Agent: If yes, when and what did the pharmacy advise?)  Is this the correct pharmacy for this prescription?  If no, delete pharmacy and type the correct one.  This is the patient's preferred pharmacy:  [facility] [address], [place] - [address] [place] [address] Phone: [phone] Fax: [phone]   Has the prescription been filled recently?   Is the patient out of the medication?   Has the patient been seen for an appointment in the last year OR does the patient have an upcoming appointment?   Can we respond through MyChart?   Agent: Please be advised that Rx refills may take up to 3 business days. We ask that you follow-up with your pharmacy.

## 2023-02-03 NOTE — Telephone Encounter (Signed)
Copied from CRM [id number]. Topic: Clinical - Medication Refill >> Feb 03, 2023  1:47 PM Lorretta Harp wrote: Most Recent Primary Care Visit:  Provider: Karie Georges  Department: [facility]  Visit Type: OFFICE VISIT  Date: 09/23/2022  Medication: atorvastatin (LIPITOR) 20 MG tablet  Has the patient contacted their pharmacy? Yes (Agent: If no, request that the patient contact the pharmacy for the refill. If patient does not wish to contact the pharmacy document the reason why and proceed with request.) (Agent: If yes, when and what did the pharmacy advise?)  Is this the correct pharmacy for this prescription? Yes If no, delete pharmacy and type the correct one.  This is the patient's preferred pharmacy:  [facility] [address] [place] - [address] [place] [address] Phone: [phone] Fax: [phone]   Has the prescription been filled recently? Yes  Is the patient out of the medication? Yes  Has the patient been seen for an appointment in the last year OR does the patient have an upcoming appointment? Yes  Can we respond through MyChart? No  Agent: Please be advised that Rx refills may take up to 3 business days. We ask that you follow-up with your pharmacy.

## 2023-06-03 ENCOUNTER — Other Ambulatory Visit: Payer: Self-pay | Admitting: Family Medicine

## 2023-06-03 DIAGNOSIS — I10 Essential (primary) hypertension: Secondary | ICD-10-CM

## 2023-06-22 ENCOUNTER — Telehealth: Payer: Self-pay

## 2023-06-22 NOTE — Telephone Encounter (Signed)
 Unfortunately this is a controlled medication and I cannot prescribed this without a visit. I would recommend using benadryl OTC 25 mg every 6-8 hours as needed for anxiety.

## 2023-06-22 NOTE — Telephone Encounter (Signed)
 Patient informed of the message below.

## 2023-06-22 NOTE — Telephone Encounter (Signed)
 Copied from CRM [id number]. Topic: Clinical - Medication Question >> Jun 22, 2023 12:12 PM Shardie S wrote: Reason for CRM: Patient states that she is going out of town via a car ride and she wants to know if she can get a stronger dosage of LORazepam  (ATIVAN ) 0.5 MG tablet or if provider can recommend an alternative OTC medication Callback [phone]

## 2023-07-07 IMAGING — DX DG SHOULDER 2+V*L*
3 series · 3 of 3 positions shown · non-contrast
Comparison: None Available.

CLINICAL DATA: left shoulder pain

EXAM:
LEFT SHOULDER - 2+ VIEW

[shoulder internal rotation ap]
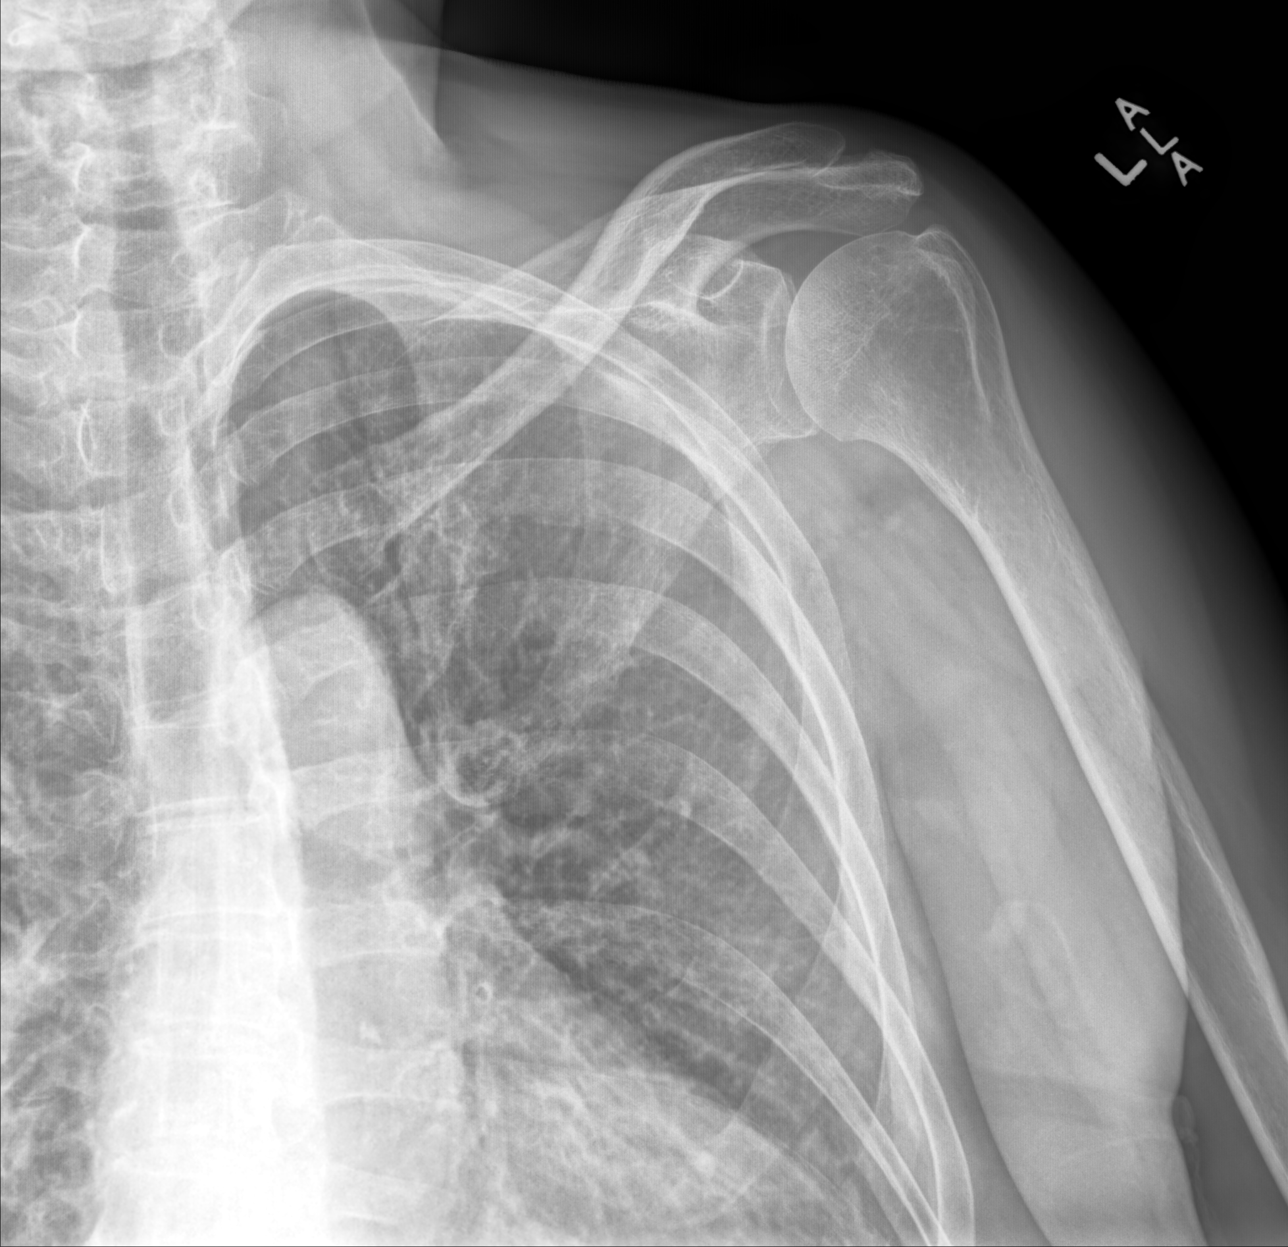

[shoulder (y view)]
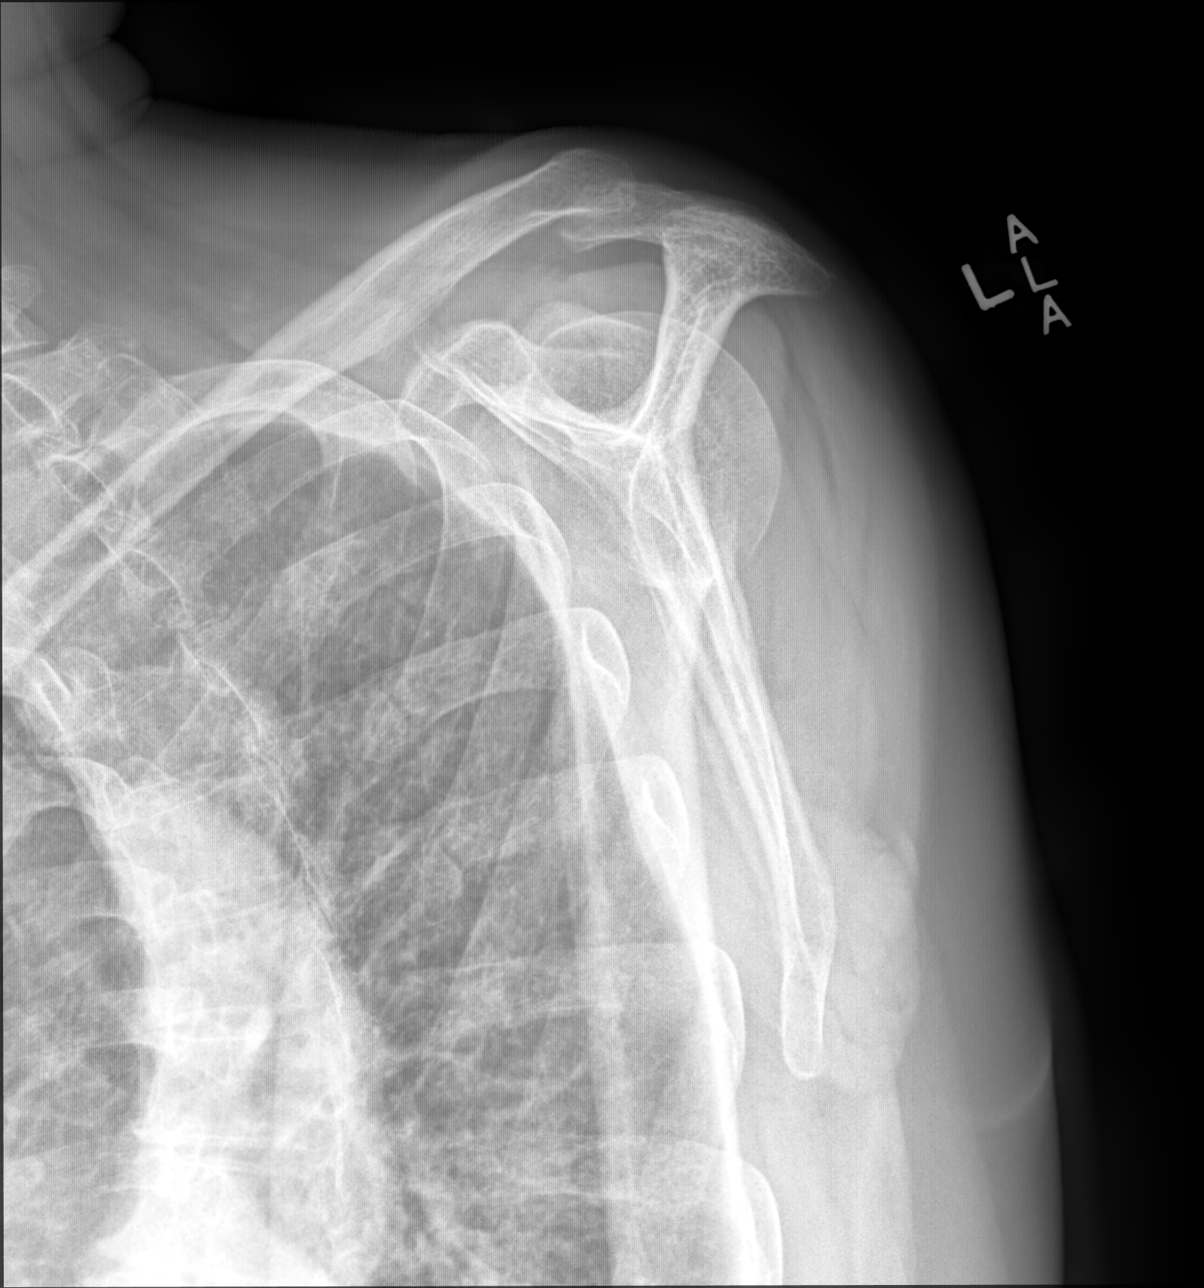

[shoulder (axial)]
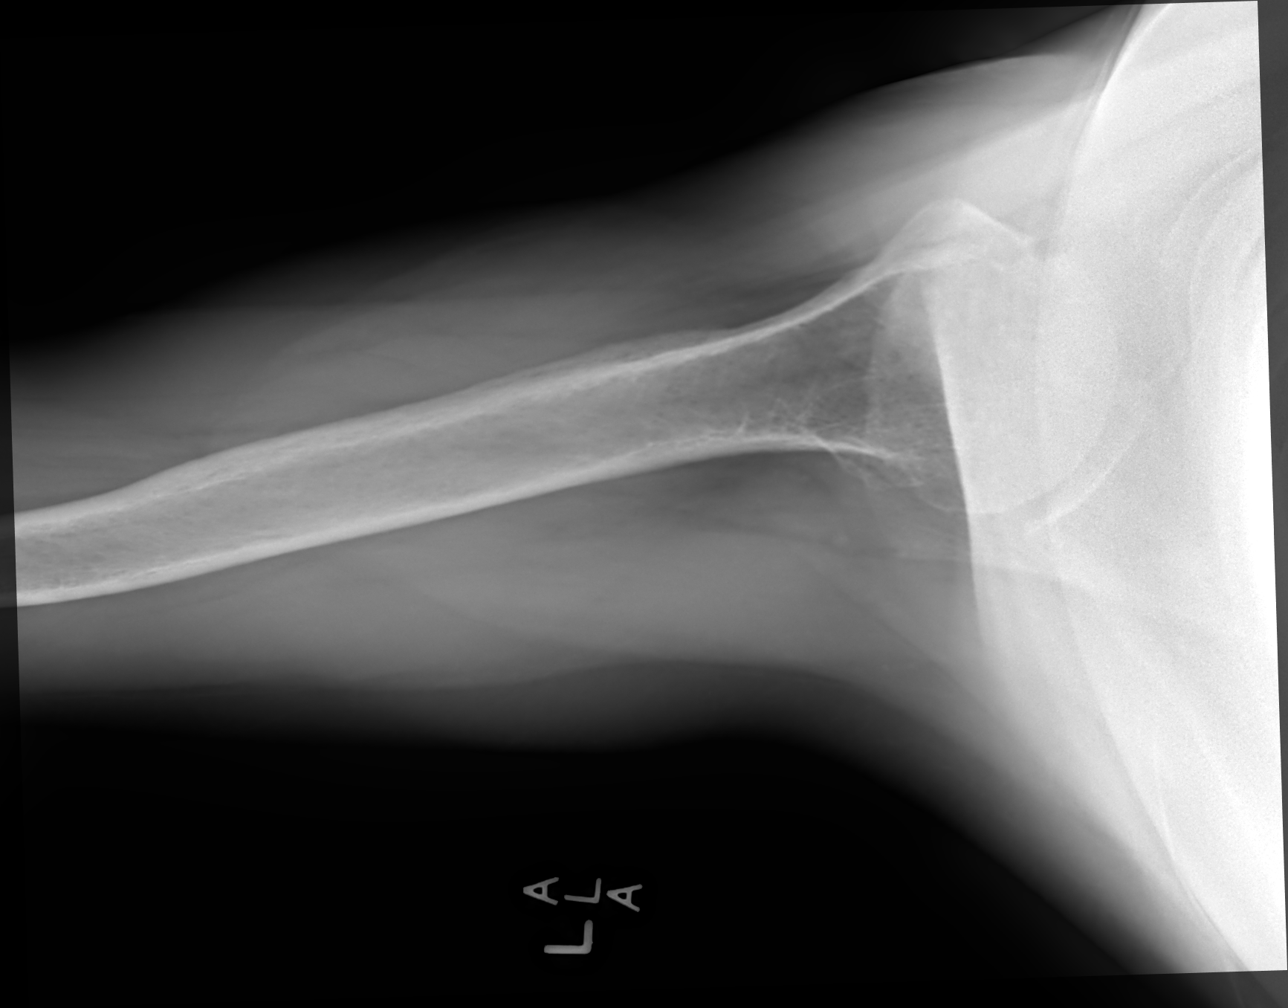

[3 of 3 positions shown; findings below may reference images not displayed]

FINDINGS: Normal alignment. No acute fracture. The soft tissues are
unremarkable.
IMPRESSION: No acute osseous abnormality or significant degenerative changes of
the shoulder.

## 2023-08-11 ENCOUNTER — Ambulatory Visit: Admitting: Family Medicine

## 2023-08-25 ENCOUNTER — Ambulatory Visit: Admitting: Family Medicine

## 2023-08-25 ENCOUNTER — Encounter: Payer: Self-pay | Admitting: Family Medicine

## 2023-08-25 DIAGNOSIS — E785 Hyperlipidemia, unspecified: Secondary | ICD-10-CM | POA: Diagnosis not present

## 2023-08-25 DIAGNOSIS — Z0000 Encounter for general adult medical examination without abnormal findings: Secondary | ICD-10-CM | POA: Diagnosis not present

## 2023-08-25 DIAGNOSIS — I10 Essential (primary) hypertension: Secondary | ICD-10-CM | POA: Diagnosis not present

## 2023-08-25 MED ORDER — AMLODIPINE BESYLATE 10 MG PO TABS: ORAL_TABLET | ORAL | 1 refills | Status: AC

## 2023-08-25 MED ORDER — ATORVASTATIN CALCIUM 20 MG PO TABS: 20.0000 mg | ORAL_TABLET | Freq: Every day | ORAL | 1 refills | Status: AC

## 2023-08-25 MED ORDER — LISINOPRIL 40 MG PO TABS: 40.0000 mg | ORAL_TABLET | Freq: Every day | ORAL | 1 refills | Status: AC

## 2023-08-25 NOTE — Progress Notes (Signed)
 Subjective:   Alexandra Flores is a 78 y.o. female who presents for Medicare Annual (Subsequent) preventive examination.  Visit Complete: Virtual I connected with  Alexandra Flores on 08/25/23 by a audio enabled telemedicine application and verified that I am speaking with the correct person using two identifiers.  Patient Location: Home  Provider Location: Office/Clinic  I discussed the limitations of evaluation and management by telemedicine. The patient expressed understanding and agreed to proceed.  Vital Signs: Because this visit was a virtual/telehealth visit, some criteria may be missing or patient reported. Any vitals not documented were not able to be obtained and vitals that have been documented are patient reported.  Patient Medicare AWV questionnaire was completed by the patient on 08/25/2023; I have confirmed that all information answered by patient is correct and no changes since this date.  Cardiac Risk Factors include: advanced age (>40men, >103 women);hypertension;smoking/ tobacco exposure     Objective:    There were no vitals filed for this visit. There is no height or weight on file to calculate BMI.     08/25/2023    4:46 PM 05/27/2021    1:45 PM 05/21/2020    1:50 PM 09/27/2013    9:40 AM  Advanced Directives  Does Patient Have a Medical Advance Directive? No No Yes Yes   Type of Chief of Staff of [place];Living will   Copy of Healthcare Power of Attorney in Chart?   No - copy requested No - copy requested   Would patient like information on creating a medical advance directive? Yes (MAU/Ambulatory/Procedural Areas - Information given) No - Patient declined       Data saved with a previous flowsheet row definition    Current Medications (verified) Outpatient Encounter Medications as of 08/25/2023  Medication Sig   calcium  carbonate (TUMS - DOSED IN MG ELEMENTAL CALCIUM ) 500 MG chewable tablet Chew 1 tablet by  mouth as needed for indigestion or heartburn.   hydrochlorothiazide  (HYDRODIURIL ) 50 MG tablet TAKE 1 TABLET BY MOUTH EVERY DAY   LORazepam  (ATIVAN ) 0.5 MG tablet Take 1 tablet (0.5 mg total) by mouth 2 (two) times daily as needed for anxiety.   Multiple Vitamins-Minerals (MULTIVITAMIN ADULTS PO) Take by mouth.   triamcinolone  cream (KENALOG ) 0.1 % Apply 1 Application topically 2 (two) times daily.   [DISCONTINUED] amLODipine  (NORVASC ) 10 MG tablet TAKE 1 TABLET BY MOUTH EVERY DAY   [DISCONTINUED] atorvastatin  (LIPITOR) 20 MG tablet Take 1 tablet (20 mg total) by mouth daily.   [DISCONTINUED] lisinopril  (ZESTRIL ) 40 MG tablet Take 1 tablet (40 mg total) by mouth daily.   amLODipine  (NORVASC ) 10 MG tablet TAKE 1 TABLET BY MOUTH EVERY DAY   atorvastatin  (LIPITOR) 20 MG tablet Take 1 tablet (20 mg total) by mouth daily.   lisinopril  (ZESTRIL ) 40 MG tablet Take 1 tablet (40 mg total) by mouth daily.   [DISCONTINUED] cyclobenzaprine  (FLEXERIL ) 10 MG tablet TAKE 1/2 TO 1 TABLET (5-10 MG TOTAL) BY MOUTH EVERY DAY AT BEDTIME   [DISCONTINUED] meloxicam  (MOBIC ) 7.5 MG tablet Take 1 tablet (7.5 mg total) by mouth daily.   No facility-administered encounter medications on file as of 08/25/2023.    Allergies (verified) Patient has no known allergies.   History: Past Medical History:  Diagnosis Date   Anxiety    Eczema    GERD (gastroesophageal reflux disease)    Hyperlipemia 09/30/2015   Hypertension    Tobacco use 02/10/2012   Past Surgical  History:  Procedure Laterality Date   BREAST SURGERY     benign cyst removal   Family History  Problem Relation Age of Onset   Stroke Mother 67   Hypertension Mother    Arthritis Father    Eczema Father    Cancer Sister 69       'stomach cancer'    Asthma Sister    HIV/AIDS Brother    Colon cancer Neg Hx    Social History   Socioeconomic History   Marital status: Married    Spouse name: Not on file   Number of children: Not on file   Years  of education: Not on file   Highest education level: Not on file  Occupational History   Not on file  Tobacco Use   Smoking status: Every Day    Current packs/day: 1.00    Average packs/day: 1 pack/day for 48.0 years (48.0 ttl pk-yrs)    Types: Cigarettes   Smokeless tobacco: Never   Tobacco comments:    per patient not even a pack a day; takes 3 puffs and puts it out   Vaping Use   Vaping status: Never Used  Substance and Sexual Activity   Alcohol use: Yes    Alcohol/week: 0.0 standard drinks of alcohol    Comment: occ    Drug use: Never   Sexual activity: Yes  Other Topics Concern   Not on file  Social History Narrative   Work or School: none, retired, worked at [employer] for 25 years       Home Situation: lives with husband; mother passed in 2017      Spiritual Beliefs: methodist, christian      Lifestyle: does bowling every Wednesday, no regular exercise otherwise, diet is not great - husband is active and exercises         Social Drivers of Corporate investment banker Strain: Low Risk  (08/25/2023)   Overall Financial Resource Strain (CARDIA)    Difficulty of Paying Living Expenses: Not hard at all  Food Insecurity: No Food Insecurity (08/25/2023)   Hunger Vital Sign    Worried About Running Out of Food in the Last Year: Never true    Ran Out of Food in the Last Year: Never true  Transportation Needs: No Transportation Needs (08/25/2023)   PRAPARE - Administrator, Civil Service (Medical): No    Lack of Transportation (Non-Medical): No  Physical Activity: Inactive (08/25/2023)   Exercise Vital Sign    Days of Exercise per Week: 0 days    Minutes of Exercise per Session: 0 min  Stress: No Stress Concern Present (08/25/2023)   [employer] of Occupational Health - Occupational Stress Questionnaire    Feeling of Stress: Not at all  Social Connections: Moderately Isolated (08/25/2023)   Social Connection and Isolation Panel    Frequency of Communication with  Friends and Family: More than three times a week    Frequency of Social Gatherings with Friends and Family: Once a week    Attends Religious Services: Never    Database administrator or Organizations: No    Attends Engineer, structural: Never    Marital Status: Married    Tobacco Counseling Ready to quit: No Counseling given: No -- patient declined tobacco counseling Tobacco comments: per patient not even a pack a day; takes 3 puffs and puts it out    Clinical Intake:  Pre-visit preparation completed: Yes  Pain : No/denies pain  Diabetes: No            Activities of Daily Living    08/25/2023    4:42 PM  In your present state of health, do you have any difficulty performing the following activities:  Hearing? 0  Vision? 0  Comment wears glasses, had cataract surgery  Difficulty concentrating or making decisions? 0  Walking or climbing stairs? 0  Dressing or bathing? 0  Doing errands, shopping? 0  Preparing Food and eating ? N  Using the Toilet? N  In the past six months, have you accidently leaked urine? Y  Comment just when she sneezes  Do you have problems with loss of bowel control? N  Managing your Medications? N  Managing your Finances? N  Housekeeping or managing your Housekeeping? N    Patient Care Team: Ozell Heron HERO, MD as PCP - General (Family Medicine) Barbette Knock, MD as Consulting Physician (Obstetrics and Gynecology)  Indicate any recent Medical Services you may have received from other than [facility] providers in the past year (date may be approximate).     Assessment:   This is a routine wellness examination for Roseline.  Hearing/Vision screen No results found.   Goals Addressed               This Visit's Progress     Increase physical activity (pt-stated)        Pt wants to start chair exercises.       Depression Screen    08/25/2023    3:38 PM 07/06/2022   11:05 AM 05/27/2021    1:41 PM 05/25/2021   12:43 PM  03/21/2019    9:21 AM 03/29/2017   11:05 AM 09/30/2015    8:31 AM  PHQ 2/9 Scores  PHQ - 2 Score 0 0 0 0 0 0 0  PHQ- 9 Score 1  0 0       Fall Risk    08/25/2023    3:38 PM 09/23/2022    1:54 PM 07/06/2022   11:05 AM 05/27/2021    1:45 PM 05/21/2020    1:52 PM  Fall Risk   Falls in the past year? 0 0 0 0 0  Number falls in past yr: 0 0 0 0 0  Injury with Fall? 0 0 0 0 0  Risk for fall due to : No Fall Risks No Fall Risks No Fall Risks No Fall Risks Impaired vision  Follow up Falls evaluation completed Falls evaluation completed Falls evaluation completed  Falls prevention discussed      Data saved with a previous flowsheet row definition    MEDICARE RISK AT HOME: Medicare Risk at Home Any stairs in or around the home?: Yes (two steps from the den to the dining room) If so, are there any without handrails?: No Home free of loose throw rugs in walkways, pet beds, electrical cords, etc?: No Adequate lighting in your home to reduce risk of falls?: Yes Life alert?: No Use of a cane, walker or w/c?: No Grab bars in the bathroom?: Yes Shower chair or bench in shower?: No (has one but doesn't use it) Elevated toilet seat or a handicapped toilet?: Yes  TIMED UP AND GO:  Was the test performed?  No    Cognitive Function:        08/25/2023    4:50 PM 05/27/2021    1:45 PM 05/21/2020    1:54 PM  6CIT Screen  What Year? 0 points 0 points 0 points  What month? 0 points 0 points 0 points  What time? 0 points 0 points   Count back from 20 0 points 0 points 0 points  Months in reverse 0 points 0 points 0 points  Repeat phrase 0 points 0 points 2 points  Total Score 0 points 0 points     Immunizations Immunization History  Administered Date(s) Administered   PFIZER(Purple Top)SARS-COV-2 Vaccination 03/15/2019, 04/10/2019, 09/02/2020   Tdap 02/24/2012    TDAP status: Due, Education has been provided regarding the importance of this vaccine. Advised may receive this vaccine at local  pharmacy or Health Dept. Aware to provide a copy of the vaccination record if obtained from local pharmacy or Health Dept. Verbalized acceptance and understanding.  Flu Vaccine status: Declined, Education has been provided regarding the importance of this vaccine but patient still declined. Advised may receive this vaccine at local pharmacy or Health Dept. Aware to provide a copy of the vaccination record if obtained from local pharmacy or Health Dept. Verbalized acceptance and understanding.  Pneumococcal vaccine status: Declined,  Education has been provided regarding the importance of this vaccine but patient still declined. Advised may receive this vaccine at local pharmacy or Health Dept. Aware to provide a copy of the vaccination record if obtained from local pharmacy or Health Dept. Verbalized acceptance and understanding.   Covid-19 vaccine status: Completed vaccines  Qualifies for Shingles Vaccine? Yes   Zostavax completed No   Shingrix Completed?: No.    Education has been provided regarding the importance of this vaccine. Patient has been advised to call insurance company to determine out of pocket expense if they have not yet received this vaccine. Advised may also receive vaccine at local pharmacy or Health Dept. Verbalized acceptance and understanding.  Screening Tests Health Maintenance  Topic Date Due   Zoster Vaccines- Shingrix (1 of 2) Never done   COVID-19 Vaccine (4 - 2024-25 season) 09/19/2022   INFLUENZA VACCINE  08/19/2023   Colonoscopy  09/28/2023   Lung Cancer Screening  08/24/2024 (Originally 11/05/1997)   Pneumococcal Vaccine: 50+ Years (1 of 2 - PCV) 08/24/2024 (Originally [birth year]1966)   DEXA SCAN  08/24/2024 (Originally 11/05/2012)   Medicare Annual Wellness (AWV)  08/24/2024   Hepatitis C Screening  Completed   Hepatitis B Vaccines  Aged Out   HPV VACCINES  Aged Out   Meningococcal B Vaccine  Aged Out   DTaP/Tdap/Td  Discontinued    Health  Maintenance  Health Maintenance Due  Topic Date Due   Zoster Vaccines- Shingrix (1 of 2) Never done   COVID-19 Vaccine (4 - 2024-25 season) 09/19/2022   INFLUENZA VACCINE  08/19/2023   Colonoscopy  09/28/2023    Colorectal cancer screening: Type of screening: Colonoscopy. Completed 09/28/2023. Repeat every 10 years  Mammogram status: No longer required due to age.  Bone Density screening: patient declined  Lung Cancer Screening: (Low Dose CT Chest recommended if Age 35-80 years, 20 pack-year currently smoking OR have quit w/in 15years.) does not qualify.   Lung Cancer Screening Referral: Patient declined  Additional Screening:  Hepatitis C Screening: does not qualify; Completed 10/05/2015  Vision Screening: Recommended annual ophthalmology exams for early detection of glaucoma and other disorders of the eye. Is the patient up to date with their annual eye exam?  Yes  Who is the provider or what is the name of the office in which the patient attends annual eye exams? Not sure If pt is not established with a provider, would they like to  be referred to a provider to establish care? No .   Dental Screening: Recommended annual dental exams for proper oral hygiene  Diabetic Foot Exam: N/A pt is not a diabetic  Community Resource Referral / Chronic Care Management: CRR required this visit?  No   CCM required this visit?  No     Plan:     I have personally reviewed and noted the following in the patient's chart:   Medical and social history Use of alcohol, tobacco or illicit drugs  Current medications and supplements including opioid prescriptions. Patient is not currently taking opioid prescriptions. Functional ability and status Nutritional status Physical activity Advanced directives List of other physicians Hospitalizations, surgeries, and ER visits in previous 12 months Vitals Screenings to include cognitive, depression, and falls Referrals and appointments  In  addition, I have reviewed and discussed with patient certain preventive protocols, quality metrics, and best practice recommendations. A written personalized care plan for preventive services as well as general preventive health recommendations were provided to patient.     Heron CHRISTELLA Sharper, MD   08/25/2023   After Visit Summary: (Mail) Due to this being a telephonic visit, the after visit summary with patients personalized plan was offered to patient via mail   Nurse Notes: N/A

## 2023-08-25 NOTE — Progress Notes (Signed)
 Patient unable to perform vital signs at home for the virtual visit.

## 2023-10-14 ENCOUNTER — Encounter: Admitting: Family Medicine

## 2023-10-14 ENCOUNTER — Ambulatory Visit: Admitting: Family Medicine

## 2023-10-14 ENCOUNTER — Encounter: Payer: Self-pay | Admitting: Family Medicine

## 2023-10-14 VITALS — BP 140/78 | HR 80 | Ht 66.0 in | Wt 161.9 lb

## 2023-10-14 DIAGNOSIS — E785 Hyperlipidemia, unspecified: Secondary | ICD-10-CM

## 2023-10-14 DIAGNOSIS — I10 Essential (primary) hypertension: Secondary | ICD-10-CM | POA: Diagnosis not present

## 2023-10-14 DIAGNOSIS — R7303 Prediabetes: Secondary | ICD-10-CM | POA: Diagnosis not present

## 2023-10-14 DIAGNOSIS — Z1211 Encounter for screening for malignant neoplasm of colon: Secondary | ICD-10-CM

## 2023-10-14 DIAGNOSIS — Z0000 Encounter for general adult medical examination without abnormal findings: Secondary | ICD-10-CM | POA: Diagnosis not present

## 2023-10-14 LAB — CBC WITH DIFFERENTIAL/PLATELET
Basophils Absolute: 0 K/uL (ref 0.0–0.1)
Basophils Relative: 0.4 % (ref 0.0–3.0)
Eosinophils Absolute: 0.1 K/uL (ref 0.0–0.7)
Eosinophils Relative: 1.4 % (ref 0.0–5.0)
HCT: 28.6 % — ABNORMAL LOW (ref 36.0–46.0)
Hemoglobin: 9.4 g/dL — ABNORMAL LOW (ref 12.0–15.0)
Lymphocytes Relative: 39.8 % (ref 12.0–46.0)
Lymphs Abs: 2.2 K/uL (ref 0.7–4.0)
MCHC: 32.8 g/dL (ref 30.0–36.0)
MCV: 81.3 fl (ref 78.0–100.0)
Monocytes Absolute: 0.4 K/uL (ref 0.1–1.0)
Monocytes Relative: 6.5 % (ref 3.0–12.0)
Neutro Abs: 2.8 K/uL (ref 1.4–7.7)
Neutrophils Relative %: 51.9 % (ref 43.0–77.0)
Platelets: 319 K/uL (ref 150.0–400.0)
RBC: 3.52 Mil/uL — ABNORMAL LOW (ref 3.87–5.11)
RDW: 16.3 % — ABNORMAL HIGH (ref 11.5–15.5)
WBC: 5.5 K/uL (ref 4.0–10.5)

## 2023-10-14 LAB — COMPREHENSIVE METABOLIC PANEL WITH GFR
ALT: 8 U/L (ref 0–35)
AST: 11 U/L (ref 0–37)
Albumin: 4.3 g/dL (ref 3.5–5.2)
Alkaline Phosphatase: 69 U/L (ref 39–117)
BUN: 17 mg/dL (ref 6–23)
CO2: 28 meq/L (ref 19–32)
Calcium: 10 mg/dL (ref 8.4–10.5)
Chloride: 105 meq/L (ref 96–112)
Creatinine, Ser: 0.99 mg/dL (ref 0.40–1.20)
GFR: 55.61 mL/min — ABNORMAL LOW (ref >60.00)
Glucose, Bld: 95 mg/dL (ref 70–99)
Potassium: 3.5 meq/L (ref 3.5–5.1)
Sodium: 138 meq/L (ref 135–145)
Total Bilirubin: 0.4 mg/dL (ref 0.2–1.2)
Total Protein: 7.7 g/dL (ref 6.0–8.3)

## 2023-10-14 LAB — LIPID PANEL
Cholesterol: 109 mg/dL (ref 0–200)
HDL: 27.1 mg/dL — ABNORMAL LOW (ref >39.00)
LDL Cholesterol: 67 mg/dL (ref 0–99)
NonHDL: 81.95
Total CHOL/HDL Ratio: 4
Triglycerides: 74 mg/dL (ref 0.0–149.0)
VLDL: 14.8 mg/dL (ref 0.0–40.0)

## 2023-10-14 LAB — HEMOGLOBIN A1C: Hgb A1c MFr Bld: 6.4 % (ref 4.6–6.5)

## 2023-10-14 NOTE — Progress Notes (Signed)
 Complete physical exam  Patient: Alexandra Flores   DOB: [birth year]   78 y.o. Female  MRN: [id number]  Subjective:    Chief Complaint  Patient presents with   Annual Exam    Bp meds taken at night; requesting dose change for ativan     Alexandra Flores is a 78 y.o. female who presents today for a complete physical exam. She reports consuming a general diet. Eating fruits and veggies. Eats good sources of protein,does eat some processed carbohydrates. Does drink regular sodas, advised she switch to zero beverages.The patient does not participate in regular exercise at present. She generally feels well. She reports sleeping well. She does not have additional problems to discuss today.   Prediabetes -  reviewed the patient's labs from last year, her A1C at the time was 6.4, pt reports she is drinking regular soda daily and I counseled her on this. Is not really exercising much. Counseled her on this as well  HTN -- BP in office today is slightly elevated, she reports compliance with her BP medication, however she does continue to smoke. I counseled the patient on nicotine and how this can increase blood pressure. I reviewed her medication list. She denies any headaches, dizziness or chest pain. She is not checking her BP at home.   Most recent fall risk assessment:    08/25/2023    3:38 PM  Fall Risk   Falls in the past year? 0  Number falls in past yr: 0  Injury with Fall? 0  Risk for fall due to : No Fall Risks  Follow up Falls evaluation completed     Most recent depression screenings:    08/25/2023    3:38 PM 07/06/2022   11:05 AM  PHQ 2/9 Scores  PHQ - 2 Score 0 0  PHQ- 9 Score 1     Vision:Within last year and Dental: No current dental problems -- has dentures  Patient Active Problem List   Diagnosis Date Noted   Prediabetes 08/21/2021   Intestinal infection due to enteropathogenic E. coli 07/28/2021   Infectious diarrhea 07/28/2021   Leukocytosis 07/28/2021   Hyperlipemia  09/30/2015   Fibroids 03/23/2012   Tobacco use 02/10/2012   KNEE PAIN 03/05/2008   Essential hypertension 03/13/2007   GERD 03/13/2007      Patient Care Team: Ozell Heron HERO, MD as PCP - General (Family Medicine) Barbette Knock, MD as Consulting Physician (Obstetrics and Gynecology)   Outpatient Medications Prior to Visit  Medication Sig   amLODipine  (NORVASC ) 10 MG tablet TAKE 1 TABLET BY MOUTH EVERY DAY   atorvastatin  (LIPITOR) 20 MG tablet Take 1 tablet (20 mg total) by mouth daily.   calcium  carbonate (TUMS - DOSED IN MG ELEMENTAL CALCIUM ) 500 MG chewable tablet Chew 1 tablet by mouth as needed for indigestion or heartburn.   hydrochlorothiazide  (HYDRODIURIL ) 50 MG tablet TAKE 1 TABLET BY MOUTH EVERY DAY   lisinopril  (ZESTRIL ) 40 MG tablet Take 1 tablet (40 mg total) by mouth daily.   LORazepam  (ATIVAN ) 0.5 MG tablet Take 1 tablet (0.5 mg total) by mouth 2 (two) times daily as needed for anxiety.   Multiple Vitamins-Minerals (MULTIVITAMIN ADULTS PO) Take by mouth.   triamcinolone  cream (KENALOG ) 0.1 % Apply 1 Application topically 2 (two) times daily.   No facility-administered medications prior to visit.    Review of Systems  HENT:  Negative for hearing loss.   Eyes:  Negative for blurred vision.  Respiratory:  Negative for shortness  of breath.   Cardiovascular:  Negative for chest pain.  Gastrointestinal: Negative.   Genitourinary: Negative.   Musculoskeletal:  Negative for back pain.  Neurological:  Negative for headaches.  Psychiatric/Behavioral:  Negative for depression.        Objective:     BP (!) 140/78   Pulse 80   Ht 5' 6 (1.676 m)   Wt 161 lb 14.4 oz (73.4 kg)   SpO2 97%   BMI 26.13 kg/m    Physical Exam Vitals reviewed.  Constitutional:      Appearance: Normal appearance. She is well-groomed and normal weight.  HENT:     Right Ear: Tympanic membrane and ear canal normal.     Left Ear: Tympanic membrane and ear canal normal.      Mouth/Throat:     Mouth: Mucous membranes are moist.     Pharynx: No posterior oropharyngeal erythema.  Eyes:     Conjunctiva/sclera: Conjunctivae normal.  Neck:     Thyroid : No thyromegaly.  Cardiovascular:     Rate and Rhythm: Normal rate and regular rhythm.     Pulses: Normal pulses.     Heart sounds: S1 normal and S2 normal.  Pulmonary:     Effort: Pulmonary effort is normal.     Breath sounds: Normal breath sounds and air entry.  Abdominal:     General: Abdomen is flat. Bowel sounds are normal.     Palpations: Abdomen is soft.  Musculoskeletal:     Right lower leg: No edema.     Left lower leg: No edema.  Lymphadenopathy:     Cervical: No cervical adenopathy.  Neurological:     Mental Status: She is alert and oriented to person, place, and time. Mental status is at baseline.     Gait: Gait is intact.  Psychiatric:        Mood and Affect: Mood and affect normal.        Speech: Speech normal.        Behavior: Behavior normal.        Judgment: Judgment normal.      No results found for any visits on 10/14/23.     Assessment & Plan:    Routine Health Maintenance and Physical Exam  Immunization History  Administered Date(s) Administered   PFIZER(Purple Top)SARS-COV-2 Vaccination 03/15/2019, 04/10/2019, 09/02/2020   Tdap 02/24/2012    Health Maintenance  Topic Date Due   Colonoscopy  09/28/2023   COVID-19 Vaccine (4 - 2025-26 season) 10/30/2023 (Originally 09/19/2023)   Zoster Vaccines- Shingrix (1 of 2) 01/13/2024 (Originally 11/05/1997)   Influenza Vaccine  04/17/2024 (Originally 08/19/2023)   Lung Cancer Screening  08/24/2024 (Originally 11/05/1997)   Pneumococcal Vaccine: 50+ Years (1 of 2 - PCV) 08/24/2024 (Originally 11/06/1966)   DEXA SCAN  08/24/2024 (Originally 11/05/2012)   Medicare Annual Wellness (AWV)  08/24/2024   Hepatitis C Screening  Completed   HPV VACCINES  Aged Out   Meningococcal B Vaccine  Aged Out   DTaP/Tdap/Td  Discontinued   Mammogram   Discontinued    Discussed health benefits of physical activity, and encouraged her to engage in regular exercise appropriate for her age and condition.  Colon cancer screening -     Cologuard  Hyperlipidemia, unspecified hyperlipidemia type Assessment & Plan: On atorvastatin  20 mg daily, ordering new lipid panel and LFT's today. She rpeorts no side effects to the medication, will continue this as prescribed.   Orders: -     Lipid panel; Future  Essential hypertension  Assessment & Plan: Current hypertension medications:       Sig   amLODipine  (NORVASC ) 10 MG tablet (Taking) TAKE 1 TABLET BY MOUTH EVERY DAY   hydrochlorothiazide  (HYDRODIURIL ) 50 MG tablet (Taking) TAKE 1 TABLET BY MOUTH EVERY DAY   lisinopril  (ZESTRIL ) 40 MG tablet (Taking) Take 1 tablet (40 mg total) by mouth daily.      Chronic, BP is borderline elevated today but this is likely due to smoking. Checking CMP and lipid panel today. I counseled the patient on reducing smoking. Her previous Bps are WNL. I advised that she check her blood pressure at home daily.   Orders: -     CBC with Differential/Platelet; Future -     Comprehensive metabolic panel with GFR; Future  Prediabetes Assessment & Plan: Chronic, not currently on medication for this. A1c ordered for today. I spent 20 minutes counseling the patient on reducing sugar and starches in her diet and trying to increase exercise.  Orders: -     Hemoglobin A1c; Future  Routine general medical examination at a health care facility  General physical exam findings are normal today except for borderline elevated blood pressure. This is likely due to smoking before her visit.  I reviewed the patient's preventative testing, immunizations, and lifestyle habits. I made appropriate recommendations and placed orders for the appropriate tests and/or vaccinations. The patient has declined all vaccinations, preventative testing that I have discussed with her and recommended  today except the cologuard test. I counseled the patient on the CDC's recommendations for healthy exercise and diet. I counseled the patient on healthy sleep habits and stress management. Handouts to reinforce the counseling were given at the conclusion of the visit.    Return in about 6 months (around 04/12/2024) for HTN and A1C check.     Heron CHRISTELLA Sharper, MD

## 2023-10-14 NOTE — Assessment & Plan Note (Addendum)
 Chronic, not currently on medication for this. A1c ordered for today. I spent 20 minutes counseling the patient on reducing sugar and starches in her diet and trying to increase exercise.

## 2023-10-14 NOTE — Patient Instructions (Signed)

## 2023-10-14 NOTE — Assessment & Plan Note (Signed)
 Current hypertension medications:       Sig   amLODipine  (NORVASC ) 10 MG tablet (Taking) TAKE 1 TABLET BY MOUTH EVERY DAY   hydrochlorothiazide  (HYDRODIURIL ) 50 MG tablet (Taking) TAKE 1 TABLET BY MOUTH EVERY DAY   lisinopril  (ZESTRIL ) 40 MG tablet (Taking) Take 1 tablet (40 mg total) by mouth daily.      Chronic, BP is borderline elevated today but this is likely due to smoking. Checking CMP and lipid panel today. I counseled the patient on reducing smoking. Her previous Bps are WNL. I advised that she check her blood pressure at home daily.

## 2023-10-14 NOTE — Assessment & Plan Note (Signed)
 On atorvastatin  20 mg daily, ordering new lipid panel and LFT's today. She rpeorts no side effects to the medication, will continue this as prescribed.

## 2023-10-18 ENCOUNTER — Ambulatory Visit: Payer: Self-pay | Admitting: Family Medicine

## 2023-10-18 DIAGNOSIS — D649 Anemia, unspecified: Secondary | ICD-10-CM

## 2023-10-18 NOTE — Progress Notes (Signed)
 Good morning. Labs are essentially the same as last year, the A1C is still showing prediabetes. Please continue to reduce sugary and starchy foods in your diet. We will need to recheck this again in 6 months to make sure it is not getting worse.  The anemia is somewhat worse than last year -- I would like to bring you back to check your iron studies. Please schedule a lab appointment at your convenience. You iron levels were normal last year so the anemia could be from another cause. But we need to check it again this year since it is somewhat worse than last year.   Pt does not use my chart

## 2023-10-19 ENCOUNTER — Other Ambulatory Visit (INDEPENDENT_AMBULATORY_CARE_PROVIDER_SITE_OTHER)

## 2023-10-19 DIAGNOSIS — D649 Anemia, unspecified: Secondary | ICD-10-CM | POA: Diagnosis not present

## 2023-10-19 LAB — IRON,TIBC AND FERRITIN PANEL
%SAT: 17 % (ref 16–45)
Ferritin: 36 ng/mL (ref 16–288)
Iron: 47 ug/dL (ref 45–160)
TIBC: 272 ug/dL (ref 250–450)

## 2023-10-21 ENCOUNTER — Ambulatory Visit: Payer: Self-pay | Admitting: Family Medicine

## 2023-10-25 NOTE — Telephone Encounter (Signed)
 Left a detailed message with the information below at the patient's cell number and to call the office to schedule an appt.

## 2023-10-25 NOTE — Progress Notes (Signed)
 Please advise patient that lorazepam  is a controlled subtstance/ medication and that it cannot be prescribed outside of a visit-- her last script was over 2 years ago and she would need to come in and we would need to review her symptoms and try other medications before considering a medication like lorazepam .

## 2024-01-27 ENCOUNTER — Other Ambulatory Visit: Payer: Self-pay | Admitting: Family Medicine

## 2024-01-27 DIAGNOSIS — I10 Essential (primary) hypertension: Secondary | ICD-10-CM
